# Patient Record
Sex: Female | Born: 1950 | Race: Black or African American | Hispanic: No | Smoking: Former smoker
Health system: Southern US, Community
[De-identification: ages and names within clinical notes are randomized; demographics above are authoritative.]

## PROBLEM LIST (undated history)

## (undated) DIAGNOSIS — M5430 Sciatica, unspecified side: Secondary | ICD-10-CM

## (undated) DIAGNOSIS — I471 Supraventricular tachycardia, unspecified: Secondary | ICD-10-CM

## (undated) DIAGNOSIS — R079 Chest pain, unspecified: Secondary | ICD-10-CM

## (undated) DIAGNOSIS — Z9889 Other specified postprocedural states: Secondary | ICD-10-CM

## (undated) DIAGNOSIS — K589 Irritable bowel syndrome without diarrhea: Secondary | ICD-10-CM

## (undated) DIAGNOSIS — E119 Type 2 diabetes mellitus without complications: Secondary | ICD-10-CM

## (undated) DIAGNOSIS — M329 Systemic lupus erythematosus, unspecified: Secondary | ICD-10-CM

## (undated) DIAGNOSIS — R002 Palpitations: Secondary | ICD-10-CM

## (undated) DIAGNOSIS — E079 Disorder of thyroid, unspecified: Secondary | ICD-10-CM

## (undated) DIAGNOSIS — Z8679 Personal history of other diseases of the circulatory system: Secondary | ICD-10-CM

## (undated) DIAGNOSIS — I10 Essential (primary) hypertension: Secondary | ICD-10-CM

## (undated) DIAGNOSIS — IMO0002 Reserved for concepts with insufficient information to code with codable children: Secondary | ICD-10-CM

## (undated) HISTORY — PX: ABDOMINAL HYSTERECTOMY: SHX81

## (undated) HISTORY — PX: ESOPHAGEAL DILATION: SHX303

## (undated) HISTORY — PX: APPENDECTOMY: SHX54

## (undated) HISTORY — PX: HERNIA REPAIR: SHX51

---

## 2008-08-29 ENCOUNTER — Emergency Department (HOSPITAL_COMMUNITY): Admission: EM | Admit: 2008-08-29 | Discharge: 2008-08-29 | Payer: Self-pay | Admitting: Emergency Medicine

## 2010-05-08 LAB — CBC
HCT: 36.8 % (ref 36.0–46.0)
Hemoglobin: 12.2 g/dL (ref 12.0–15.0)
MCHC: 33.2 g/dL (ref 30.0–36.0)
MCV: 85.3 fL (ref 78.0–100.0)
RBC: 4.32 MIL/uL (ref 3.87–5.11)

## 2010-05-08 LAB — DIFFERENTIAL
Lymphocytes Relative: 28 % (ref 12–46)
Monocytes Absolute: 0.5 10*3/uL (ref 0.1–1.0)
Monocytes Relative: 4 % (ref 3–12)
Neutro Abs: 8.8 10*3/uL — ABNORMAL HIGH (ref 1.7–7.7)

## 2010-05-08 LAB — URINE MICROSCOPIC-ADD ON

## 2010-05-08 LAB — URINALYSIS, ROUTINE W REFLEX MICROSCOPIC
Glucose, UA: NEGATIVE mg/dL
Leukocytes, UA: NEGATIVE
Specific Gravity, Urine: 1.013 (ref 1.005–1.030)
Urobilinogen, UA: 0.2 mg/dL (ref 0.0–1.0)

## 2010-05-08 LAB — BASIC METABOLIC PANEL
CO2: 28 mEq/L (ref 19–32)
Chloride: 100 mEq/L (ref 96–112)
GFR calc Af Amer: >60 mL/min (ref >60)
Potassium: 3 mEq/L — ABNORMAL LOW (ref 3.5–5.1)
Sodium: 138 mEq/L (ref 135–145)

## 2010-05-08 LAB — RAPID URINE DRUG SCREEN, HOSP PERFORMED
Benzodiazepines: NOT DETECTED
Tetrahydrocannabinol: NOT DETECTED

## 2014-04-26 ENCOUNTER — Other Ambulatory Visit (HOSPITAL_COMMUNITY): Payer: Self-pay

## 2014-04-26 ENCOUNTER — Encounter (HOSPITAL_COMMUNITY): Payer: Self-pay | Admitting: *Deleted

## 2014-04-26 ENCOUNTER — Emergency Department (HOSPITAL_COMMUNITY): Payer: Medicare Other

## 2014-04-26 ENCOUNTER — Observation Stay (HOSPITAL_COMMUNITY)
Admission: EM | Admit: 2014-04-26 | Discharge: 2014-04-27 | Disposition: A | Discharge status: Home / Self Care | Payer: Medicare Other | Attending: Internal Medicine | Admitting: Internal Medicine

## 2014-04-26 DIAGNOSIS — R0602 Shortness of breath: Secondary | ICD-10-CM | POA: Insufficient documentation

## 2014-04-26 DIAGNOSIS — E119 Type 2 diabetes mellitus without complications: Secondary | ICD-10-CM

## 2014-04-26 DIAGNOSIS — Z8679 Personal history of other diseases of the circulatory system: Secondary | ICD-10-CM

## 2014-04-26 DIAGNOSIS — Z886 Allergy status to analgesic agent status: Secondary | ICD-10-CM | POA: Diagnosis not present

## 2014-04-26 DIAGNOSIS — R9431 Abnormal electrocardiogram [ECG] [EKG]: Secondary | ICD-10-CM | POA: Diagnosis not present

## 2014-04-26 DIAGNOSIS — Z9889 Other specified postprocedural states: Secondary | ICD-10-CM

## 2014-04-26 DIAGNOSIS — I10 Essential (primary) hypertension: Secondary | ICD-10-CM | POA: Diagnosis not present

## 2014-04-26 DIAGNOSIS — Z9049 Acquired absence of other specified parts of digestive tract: Secondary | ICD-10-CM | POA: Insufficient documentation

## 2014-04-26 DIAGNOSIS — I471 Supraventricular tachycardia, unspecified: Secondary | ICD-10-CM | POA: Diagnosis present

## 2014-04-26 DIAGNOSIS — R002 Palpitations: Secondary | ICD-10-CM | POA: Diagnosis present

## 2014-04-26 DIAGNOSIS — E118 Type 2 diabetes mellitus with unspecified complications: Secondary | ICD-10-CM

## 2014-04-26 DIAGNOSIS — E876 Hypokalemia: Secondary | ICD-10-CM

## 2014-04-26 DIAGNOSIS — Z888 Allergy status to other drugs, medicaments and biological substances status: Secondary | ICD-10-CM | POA: Diagnosis not present

## 2014-04-26 DIAGNOSIS — M329 Systemic lupus erythematosus, unspecified: Secondary | ICD-10-CM | POA: Diagnosis not present

## 2014-04-26 DIAGNOSIS — R531 Weakness: Secondary | ICD-10-CM

## 2014-04-26 DIAGNOSIS — Z87891 Personal history of nicotine dependence: Secondary | ICD-10-CM | POA: Diagnosis not present

## 2014-04-26 DIAGNOSIS — R079 Chest pain, unspecified: Secondary | ICD-10-CM | POA: Diagnosis present

## 2014-04-26 DIAGNOSIS — R0609 Other forms of dyspnea: Secondary | ICD-10-CM

## 2014-04-26 HISTORY — DX: Essential (primary) hypertension: I10

## 2014-04-26 HISTORY — DX: Supraventricular tachycardia: I47.1

## 2014-04-26 HISTORY — DX: Supraventricular tachycardia, unspecified: I47.10

## 2014-04-26 HISTORY — DX: Other specified postprocedural states: Z98.890

## 2014-04-26 HISTORY — DX: Type 2 diabetes mellitus without complications: E11.9

## 2014-04-26 HISTORY — DX: Personal history of other diseases of the circulatory system: Z86.79

## 2014-04-26 HISTORY — DX: Systemic lupus erythematosus, unspecified: M32.9

## 2014-04-26 LAB — CBC WITH DIFFERENTIAL/PLATELET
Basophils Absolute: 0 10*3/uL (ref 0.0–0.1)
Basophils Relative: 0 % (ref 0–1)
EOS ABS: 0.1 10*3/uL (ref 0.0–0.7)
EOS PCT: 1 % (ref 0–5)
HEMATOCRIT: 38.2 % (ref 36.0–46.0)
Hemoglobin: 12.4 g/dL (ref 12.0–15.0)
LYMPHS PCT: 29 % (ref 12–46)
Lymphs Abs: 3 10*3/uL (ref 0.7–4.0)
MCH: 26.6 pg (ref 26.0–34.0)
MCHC: 32.5 g/dL (ref 30.0–36.0)
MCV: 81.8 fL (ref 78.0–100.0)
MONOS PCT: 6 % (ref 3–12)
Monocytes Absolute: 0.7 10*3/uL (ref 0.1–1.0)
NEUTROS PCT: 64 % (ref 43–77)
Neutro Abs: 6.7 10*3/uL (ref 1.7–7.7)
Platelets: 228 10*3/uL (ref 150–400)
RBC: 4.67 MIL/uL (ref 3.87–5.11)
RDW: 17.5 % — AB (ref 11.5–15.5)
WBC: 10.5 10*3/uL (ref 4.0–10.5)

## 2014-04-26 LAB — BASIC METABOLIC PANEL
ANION GAP: 11 (ref 5–15)
BUN: 18 mg/dL (ref 6–23)
CALCIUM: 9.6 mg/dL (ref 8.4–10.5)
CHLORIDE: 101 mmol/L (ref 96–112)
CO2: 25 mmol/L (ref 19–32)
Creatinine, Ser: 0.98 mg/dL (ref 0.50–1.10)
GFR, EST AFRICAN AMERICAN: 68 mL/min — AB (ref >90)
GFR, EST NON AFRICAN AMERICAN: 59 mL/min — AB (ref >90)
Glucose, Bld: 139 mg/dL — ABNORMAL HIGH (ref 70–99)
POTASSIUM: 3.3 mmol/L — AB (ref 3.5–5.1)
SODIUM: 137 mmol/L (ref 135–145)

## 2014-04-26 LAB — I-STAT TROPONIN, ED: Troponin i, poc: 0 ng/mL (ref 0.00–0.08)

## 2014-04-26 LAB — BRAIN NATRIURETIC PEPTIDE: B NATRIURETIC PEPTIDE 5: 51.7 pg/mL (ref 0.0–100.0)

## 2014-04-26 LAB — D-DIMER, QUANTITATIVE: D-Dimer, Quant: 0.47 ug/mL-FEU (ref 0.00–0.48)

## 2014-04-26 MED ORDER — FELODIPINE ER 10 MG PO TB24
10.0000 mg | ORAL_TABLET | Freq: Every day | ORAL | Status: DC
  Administered 2014-04-27: 10 mg via ORAL
  Filled 2014-04-26: qty 1

## 2014-04-26 MED ORDER — INSULIN ASPART 100 UNIT/ML ~~LOC~~ SOLN: 0.0000 [IU] | Freq: Every day | SUBCUTANEOUS | Status: DC

## 2014-04-26 MED ORDER — INSULIN ASPART 100 UNIT/ML ~~LOC~~ SOLN: 0.0000 [IU] | Freq: Three times a day (TID) | SUBCUTANEOUS | Status: DC

## 2014-04-26 MED ORDER — NITROGLYCERIN 0.4 MG SL SUBL: 0.4000 mg | SUBLINGUAL_TABLET | SUBLINGUAL | Status: DC | PRN

## 2014-04-26 MED ORDER — MORPHINE SULFATE 2 MG/ML IJ SOLN
2.0000 mg | INTRAMUSCULAR | Status: DC | PRN
  Administered 2014-04-27: 2 mg via INTRAVENOUS
  Filled 2014-04-26: qty 1

## 2014-04-26 MED ORDER — ONDANSETRON HCL 4 MG/2ML IJ SOLN: 4.0000 mg | Freq: Four times a day (QID) | INTRAMUSCULAR | Status: DC | PRN

## 2014-04-26 MED ORDER — PREDNISOLONE 5 MG PO TABS
5.0000 mg | ORAL_TABLET | Freq: Every day | ORAL | Status: DC
  Administered 2014-04-27: 5 mg via ORAL
  Filled 2014-04-26: qty 1

## 2014-04-26 MED ORDER — PANTOPRAZOLE SODIUM 40 MG PO TBEC
40.0000 mg | DELAYED_RELEASE_TABLET | Freq: Every day | ORAL | Status: DC
  Administered 2014-04-27: 40 mg via ORAL
  Filled 2014-04-26: qty 1

## 2014-04-26 MED ORDER — ASPIRIN EC 81 MG PO TBEC
81.0000 mg | DELAYED_RELEASE_TABLET | Freq: Every day | ORAL | Status: DC
  Administered 2014-04-27: 81 mg via ORAL
  Filled 2014-04-26: qty 1

## 2014-04-26 MED ORDER — ENOXAPARIN SODIUM 40 MG/0.4ML ~~LOC~~ SOLN
40.0000 mg | SUBCUTANEOUS | Status: DC
  Administered 2014-04-27: 40 mg via SUBCUTANEOUS
  Filled 2014-04-26 (×2): qty 0.4

## 2014-04-26 MED ORDER — POTASSIUM CHLORIDE CRYS ER 20 MEQ PO TBCR
40.0000 meq | EXTENDED_RELEASE_TABLET | Freq: Once | ORAL | Status: AC
  Administered 2014-04-27: 40 meq via ORAL
  Filled 2014-04-26: qty 2

## 2014-04-26 MED ORDER — METOPROLOL TARTRATE 25 MG PO TABS
100.0000 mg | ORAL_TABLET | Freq: Two times a day (BID) | ORAL | Status: DC
  Administered 2014-04-27 (×2): 100 mg via ORAL
  Filled 2014-04-26 (×3): qty 4

## 2014-04-26 MED ORDER — ACETAMINOPHEN 325 MG PO TABS
650.0000 mg | ORAL_TABLET | ORAL | Status: DC | PRN
  Administered 2014-04-26: 650 mg via ORAL
  Filled 2014-04-26: qty 2

## 2014-04-26 MED ORDER — ATORVASTATIN CALCIUM 40 MG PO TABS
40.0000 mg | ORAL_TABLET | Freq: Every day | ORAL | Status: DC
  Filled 2014-04-26: qty 1

## 2014-04-26 NOTE — ED Notes (Signed)
MD at bedside. 

## 2014-04-26 NOTE — ED Provider Notes (Signed)
CSN: [id number]     Arrival date & time 04/26/14  1905 History   First MD Initiated Contact with Patient 04/26/14 1923     Chief Complaint  Patient presents with  . Shortness of Breath     (Consider location/radiation/quality/duration/timing/severity/associated sxs/prior Treatment) HPI Comments: PT comes in with cc of shortness of breath, weakness, and palpitations. Pt has hx of PSVT. She denies any CAD hx. Reports having palpitations earlier today, with shortness of breath and dizziness. She has loop recorder in place - interrogation reveals frequent episodes of tachycardia earlier today. Pt arrives to the ER in sinus rhythm, still feeling a little dyspneic, but with no chest pain. She has had stress test in the past 1 year, negative yield.  Patient is a 64 y.o. female presenting with shortness of breath. The history is provided by the patient.  Shortness of Breath Associated symptoms: no abdominal pain, no chest pain, no headaches and no vomiting     Past Medical History  Diagnosis Date  . SVT (supraventricular tachycardia)    Past Surgical History  Procedure Laterality Date  . Appendectomy    . Abdominal hysterectomy    . Hernia repair    . Esophageal dilation     No family history on file. History  Substance Use Topics  . Smoking status: Former Smoker    Types: Cigarettes  . Smokeless tobacco: Not on file  . Alcohol Use: No   OB History    No data available     Review of Systems  Constitutional: Positive for activity change.  Respiratory: Positive for shortness of breath.   Cardiovascular: Positive for palpitations. Negative for chest pain.  Gastrointestinal: Negative for nausea, vomiting and abdominal pain.  Genitourinary: Negative for dysuria.  Neurological: Positive for weakness. Negative for headaches.  All other systems reviewed and are negative.     Allergies  Nexium  Home Medications   Prior to Admission medications   Not on File   BP 145/72  mmHg  Pulse 82  Temp(Src) 98 F (36.7 C) (Oral)  Resp 15  Ht 5\' 4"  (1.626 m)  Wt 217 lb (98.431 kg)  BMI 37.23 kg/m2  SpO2 98% Physical Exam  Constitutional: She is oriented to person, place, and time. She appears well-developed and well-nourished.  HENT:  Head: Normocephalic and atraumatic.  Eyes: EOM are normal. Pupils are equal, round, and reactive to light.  Neck: Neck supple. No JVD present.  Cardiovascular: Normal rate, regular rhythm and normal heart sounds.   Pulmonary/Chest: Effort normal. No respiratory distress.  Abdominal: Soft. She exhibits no distension. There is no tenderness. There is no rebound and no guarding.  Neurological: She is alert and oriented to person, place, and time.  Skin: Skin is warm and dry.  Nursing note and vitals reviewed.   ED Course  Procedures (including critical care time) Labs Review Labs Reviewed  CBC WITH DIFFERENTIAL/PLATELET - Abnormal; Notable for the following:    RDW 17.5 (*)    All other components within normal limits  BASIC METABOLIC PANEL - Abnormal; Notable for the following:    Potassium 3.3 (*)    Glucose, Bld 139 (*)    GFR calc non Af Amer 59 (*)    GFR calc Af Amer 68 (*)    All other components within normal limits  BRAIN NATRIURETIC PEPTIDE  HEPATIC FUNCTION PANEL  LIPASE, BLOOD  MAGNESIUM  I-STAT TROPOININ, ED    Imaging Review Dg Chest Portable 1 View  04/26/2014  CLINICAL DATA:  Shortness of Breath  EXAM: PORTABLE CHEST - 1 VIEW  COMPARISON:  08/29/2008  FINDINGS: Cardiac shadow is stable. The lungs are clear bilaterally. A monitoring device is noted over the anterior midline. The bony structures are within normal limits.  IMPRESSION: No acute abnormality noted.   Electronically Signed   By: Alcide Clever M.D.   On: 04/26/2014 20:06     EKG Interpretation None      MDM   Final diagnoses:  Dyspnea on exertion  Weakness    Pt comes in with cc of DIB, weakness. She has hx of PSVT - and her initial  shortness of breath was associated with the palpitations - which resolved spontaneously. She is still feeling a little short of breath. There is no chest pain at any point.  EKG has avR ST elevation, with inferior and lateral depressions. Repeat EKG has no dynamic changes, no contiguous leads showing the ST elevation. Ran it by Cards, and Dr. [place] agrees, that likely some ischemic changes, but no STEMI critieria med.  Given all the information, we think it is better to admit patient to the hospital for a rule out.  Derwood Kaplan, MD 04/26/14 2235

## 2014-04-26 NOTE — ED Notes (Signed)
Two attempts at starting an IV in the right hand and AC. A successful IV was achieved both times and after the needle was removed the patient jumped and complained of pain. Positive blood draw back and no resistance during flush but due to patients complaints of pain the IV's were removed. Awaiting to see if an order for fluids or IV medications are ordered before another attempt at an IV is attempted.

## 2014-04-26 NOTE — ED Notes (Signed)
To ED via [facility] for eval of sob and weakness. Per EMS, pt had a palpable pulse at a rate of approx 180 on first contact. Once monitor placed pt's rate was down to 100. Hx of SVT, last being in Feb. Pt has a LOOP monitor. States she is SOB but not as much as she was earlier. C/o right side pain. No nausea. Skin w/d.

## 2014-04-26 NOTE — H&P (Signed)
[facility] History and Physical  Patient: Anna Lester  MRN: [id number]  DOB: January 14, 1951  DOS: the patient was seen and examined on 04/26/2014 PCP: Rocky Morel, MD  Chief Complaint: palpitation  HPI: Anna Lester is a 64 y.o. female with Past medical history of SVT, SLE, hypertension, diabetes mellitus, failed attempt for radiofrequency ablation for SVT, loop recorder. The patient is presenting with complaints of palpitation. She mentions that she knows when she has an episode of SVT and today after working toward a day later on in the afternoon she started having complaints of palpitation. She also felt dizzy associated with shortness of breath. She had to lie down to feel better. The episode resolved on its own within a few minutes but then she started having similar symptoms again. This time she had severe pain on the right side as well and the pain was not the similar to her prior pain. The pain feels like a dull ache. Pain does not get worse with pressure does not get worse with movement. Patient mentions she is compliant with all medications. Patient needs to get her prior medical care at [facility] and has moved here since February beginning.  The patient is coming from home. And at her baseline independent for most of her ADL.  Review of Systems: as mentioned in the history of present illness.  A Comprehensive review of the other systems is negative.  Past Medical History  Diagnosis Date  . SVT (supraventricular tachycardia)   . SLE (systemic lupus erythematosus)   . Essential hypertension   . Diabetes mellitus, type 2   . S/P radiofrequency ablation operation for arrhythmia    Past Surgical History  Procedure Laterality Date  . Appendectomy    . Abdominal hysterectomy    . Hernia repair    . Esophageal dilation     Social History:  reports that she has quit smoking. Her smoking use included Cigarettes. She does not have any smokeless tobacco  history on file. She reports that she does not drink alcohol. Her drug history is not on file.  Allergies  Allergen Reactions  . Ace Inhibitors Other (See Comments)    cough  . Amlodipine Swelling  . Codeine Nausea Only  . Isosorbide Other (See Comments)    dizziness  . Metformin And Related Diarrhea  . Tramadol Nausea Only  . Nexium [Esomeprazole] Rash    Family History  Problem Relation Age of Onset  . Colon cancer Mother   . Leukemia Sister     Prior to Admission medications   Medication Sig Start Date End Date Taking? Authorizing Provider  acetaminophen (TYLENOL) 500 MG tablet Take by mouth. 02/13/13   Historical Provider, MD  ammonium lactate (LAC-HYDRIN) 12 % lotion Apply to both feet BID  Indications: Abnormal Dryness of Skin, Eyes or Mucous Membranes 03/04/13   Historical Provider, MD  aspirin 81 MG EC tablet Take by mouth. 11/20/12   Historical Provider, MD  atorvastatin (LIPITOR) 40 MG tablet Take by mouth. 02/13/13   Historical Provider, MD  felodipine (PLENDIL) 10 MG 24 hr tablet Take by mouth. 04/04/13   Historical Provider, MD  glimepiride (AMARYL) 4 MG tablet Take by mouth. 05/05/13   Historical Provider, MD  losartan-hydrochlorothiazide Mauri Reading) 100-25 MG per tablet Take by mouth. 05/21/13   Historical Provider, MD  metoprolol (LOPRESSOR) 100 MG tablet Take by mouth. 01/21/13   Historical Provider, MD  omeprazole (PRILOSEC) 40 MG capsule Take by mouth. 03/28/13   Historical Provider, MD  polyethylene glycol (MIRALAX / GLYCOLAX) packet Take by mouth. 04/30/13   Historical Provider, MD  prednisoLONE 5 MG TABS tablet Take by mouth.    Historical Provider, MD  pregabalin (LYRICA) 75 MG capsule Take by mouth. 04/23/13   Historical Provider, MD    Physical Exam: Filed Vitals:   04/26/14 2130 04/26/14 2145 04/26/14 2200 04/26/14 2215  BP: 156/74 155/87 132/68 145/72  Pulse: 81 78 84 82  Temp:      TempSrc:      Resp: 8 21 21 15   Height:      Weight:      SpO2: 100% 99% 96%  98%    General: Alert, Awake and Oriented to Time, Place and Person. Appear in mild distress Eyes: PERRL ENT: Oral Mucosa clear moist. Neck: no JVD Cardiovascular: S1 and S2 Present, no Murmur, Peripheral Pulses Present Respiratory: Bilateral Air entry equal and Decreased, Clear to Auscultation, noCrackles, no wheezes Abdomen: Bowel Sound presentn, Soft and nopn tender Skin: no Rash Extremities: Bilateral Pedal edema, non calf tenderness Neurologic: Grossly no focal neuro deficit.  Labs on Admission:  CBC:  Recent Labs Lab 04/26/14 1946  WBC 10.5  NEUTROABS 6.7  HGB 12.4  HCT 38.2  MCV 81.8  PLT 228    CMP     Component Value Date/Time   NA 137 04/26/2014 1946   K 3.3* 04/26/2014 1946   CL 101 04/26/2014 1946   CO2 25 04/26/2014 1946   GLUCOSE 139* 04/26/2014 1946   BUN 18 04/26/2014 1946   CREATININE 0.98 04/26/2014 1946   CALCIUM 9.6 04/26/2014 1946   GFRNONAA 59* 04/26/2014 1946   GFRAA 68* 04/26/2014 1946    No results for input(s): LIPASE, AMYLASE in the last 168 hours.  No results for input(s): CKTOTAL, CKMB, CKMBINDEX, TROPONINI in the last 168 hours. BNP (last 3 results)  Recent Labs  04/26/14 1946  BNP 51.7    ProBNP (last 3 results) No results for input(s): PROBNP in the last 8760 hours.   Radiological Exams on Admission: Dg Chest Portable 1 View  04/26/2014   CLINICAL DATA:  Shortness of Breath  EXAM: PORTABLE CHEST - 1 VIEW  COMPARISON:  08/29/2008  FINDINGS: Cardiac shadow is stable. The lungs are clear bilaterally. A monitoring device is noted over the anterior midline. The bony structures are within normal limits.  IMPRESSION: No acute abnormality noted.   Electronically Signed   By: Alcide CleverMark  Lukens M.D.   On: 04/26/2014 20:06   EKG: Independently reviewed. normal sinus rhythm, nonspecific ST and T waves changes.  Assessment/Plan Principal Problem:   SVT (supraventricular tachycardia) Active Problems:   Abnormal EKG   Chest pain   SLE  (systemic lupus erythematosus)   Essential hypertension   Diabetes mellitus, type 2   S/P radiofrequency ablation operation for arrhythmia   1. SVT (supraventricular tachycardia) The patient is presenting with complaints of palpitation and shortness of breath with dizziness. EKG does not show any evidence of arrhythmia. He does show evidence of ST-T wave changes which was discussed with cardiology by ER physician. Currently recommended to rule out for ACS. Therefore the patient will be admitted in the hospital for observation. Monitor serial troponin, we will get echocardiogram In the morning. Also medical and cardiology consultation for further workup related to SVT as well as for evaluation of chest pain.  Also replace potassium 4 hypokalemia. Check serum magnesium.  2. SLE. Continue prednisone.  3. History of diabetes mellitus. The patient has not taking her  oral hypoglycemic agent. Continue sliding scale check hemoglobin A1c.  Advance goals of care discussion: Full code   Consults: Cardiology  DVT Prophylaxis:  chronic therapeutic anticoagulation. Nutrition: Nothing by mouth except medications  Disposition: Admitted to observation in telemetry unit.  Author: Lynden Oxford, MD [facility] Pager: [phone] 04/26/2014, 10:58 PM    If 7PM-7AM, please contact night-coverage [email or web address] Password [email or web address]

## 2014-04-27 ENCOUNTER — Encounter (HOSPITAL_COMMUNITY): Payer: Self-pay | Admitting: *Deleted

## 2014-04-27 DIAGNOSIS — Z9889 Other specified postprocedural states: Secondary | ICD-10-CM

## 2014-04-27 DIAGNOSIS — R0789 Other chest pain: Secondary | ICD-10-CM | POA: Diagnosis not present

## 2014-04-27 DIAGNOSIS — I471 Supraventricular tachycardia: Secondary | ICD-10-CM | POA: Diagnosis not present

## 2014-04-27 DIAGNOSIS — E119 Type 2 diabetes mellitus without complications: Secondary | ICD-10-CM

## 2014-04-27 DIAGNOSIS — I10 Essential (primary) hypertension: Secondary | ICD-10-CM | POA: Diagnosis not present

## 2014-04-27 DIAGNOSIS — R9431 Abnormal electrocardiogram [ECG] [EKG]: Secondary | ICD-10-CM | POA: Diagnosis not present

## 2014-04-27 LAB — COMPREHENSIVE METABOLIC PANEL
ALT: 19 U/L (ref 0–35)
AST: 23 U/L (ref 0–37)
Albumin: 3.4 g/dL — ABNORMAL LOW (ref 3.5–5.2)
Alkaline Phosphatase: 107 U/L (ref 39–117)
Anion gap: 10 (ref 5–15)
BUN: 18 mg/dL (ref 6–23)
CALCIUM: 9.3 mg/dL (ref 8.4–10.5)
CO2: 25 mmol/L (ref 19–32)
Chloride: 102 mmol/L (ref 96–112)
Creatinine, Ser: 0.89 mg/dL (ref 0.50–1.10)
GFR calc Af Amer: 77 mL/min — ABNORMAL LOW (ref >90)
GFR calc non Af Amer: 66 mL/min — ABNORMAL LOW (ref >90)
Glucose, Bld: 101 mg/dL — ABNORMAL HIGH (ref 70–99)
Potassium: 3.8 mmol/L (ref 3.5–5.1)
SODIUM: 137 mmol/L (ref 135–145)
TOTAL PROTEIN: 6.4 g/dL (ref 6.0–8.3)
Total Bilirubin: 0.5 mg/dL (ref 0.3–1.2)

## 2014-04-27 LAB — HEPATIC FUNCTION PANEL
ALBUMIN: 4 g/dL (ref 3.5–5.2)
ALK PHOS: 132 U/L — AB (ref 39–117)
ALT: 22 U/L (ref 0–35)
AST: 28 U/L (ref 0–37)
Bilirubin, Direct: <0.1 mg/dL (ref 0.0–0.5)
Total Bilirubin: 0.2 mg/dL — ABNORMAL LOW (ref 0.3–1.2)
Total Protein: 7.8 g/dL (ref 6.0–8.3)

## 2014-04-27 LAB — GLUCOSE, CAPILLARY
GLUCOSE-CAPILLARY: 109 mg/dL — AB (ref 70–99)
GLUCOSE-CAPILLARY: 119 mg/dL — AB (ref 70–99)
Glucose-Capillary: 101 mg/dL — ABNORMAL HIGH (ref 70–99)

## 2014-04-27 LAB — TROPONIN I
TROPONIN I: 0.03 ng/mL (ref <0.031)
Troponin I: <0.03 ng/mL (ref <0.031)

## 2014-04-27 LAB — CBC WITH DIFFERENTIAL/PLATELET
Basophils Absolute: 0 10*3/uL (ref 0.0–0.1)
Basophils Relative: 0 % (ref 0–1)
Eosinophils Absolute: 0.1 10*3/uL (ref 0.0–0.7)
Eosinophils Relative: 1 % (ref 0–5)
HCT: 37 % (ref 36.0–46.0)
HEMOGLOBIN: 11.9 g/dL — AB (ref 12.0–15.0)
LYMPHS PCT: 38 % (ref 12–46)
Lymphs Abs: 3.7 10*3/uL (ref 0.7–4.0)
MCH: 26.2 pg (ref 26.0–34.0)
MCHC: 32.2 g/dL (ref 30.0–36.0)
MCV: 81.3 fL (ref 78.0–100.0)
Monocytes Absolute: 0.5 10*3/uL (ref 0.1–1.0)
Monocytes Relative: 5 % (ref 3–12)
NEUTROS ABS: 5.5 10*3/uL (ref 1.7–7.7)
NEUTROS PCT: 56 % (ref 43–77)
Platelets: 203 10*3/uL (ref 150–400)
RBC: 4.55 MIL/uL (ref 3.87–5.11)
RDW: 17.4 % — ABNORMAL HIGH (ref 11.5–15.5)
WBC: 9.8 10*3/uL (ref 4.0–10.5)

## 2014-04-27 LAB — TSH: TSH: 1.437 u[IU]/mL (ref 0.350–4.500)

## 2014-04-27 LAB — T4, FREE: FREE T4: 1.09 ng/dL (ref 0.80–1.80)

## 2014-04-27 LAB — MAGNESIUM: Magnesium: 2.1 mg/dL (ref 1.5–2.5)

## 2014-04-27 LAB — LIPASE, BLOOD: Lipase: 33 U/L (ref 11–59)

## 2014-04-27 MED ORDER — METOPROLOL TARTRATE 100 MG PO TABS: 100.0000 mg | ORAL_TABLET | Freq: Two times a day (BID) | ORAL | Status: AC

## 2014-04-27 NOTE — Consult Note (Signed)
Admit date: 04/26/2014 Referring Physician  Dr. Jomarie Longs Primary Physician Rocky Morel, MD Primary Cardiologist  new Reason for Consultation  PSVT  HPI: 64 year old with PSVT on chronic metoprolol with prior ablation in [place] (EP in [place] states that she might need to go back in but described risk of pacer) who came in with palpitations characteristic of her prior episodes of SVT. Lie down, improved, dizzy SOB. Resolved on own and then returned. Right sided pain during episode. Dull. Fleeting.   She states that she is having these SVT episodes about twice a week and they are usually brought on by exertion and short lived.   Loop recorder in place - shows frequent episode of tachycardia (per ER interrogation). Per EMS had pulse of 180 on contact with patient (palpable pulse). HR down to 100 once monitor placed.   NUC stress less than one year ago was normal per patient.   Troponin is normal.   Now feels great. No CP, no SOB. Getting back to normal she states.   TELE with no further SVT.    PMH:   Past Medical History  Diagnosis Date  . SVT (supraventricular tachycardia)   . SLE (systemic lupus erythematosus)   . Essential hypertension   . Diabetes mellitus, type 2   . S/P radiofrequency ablation operation for arrhythmia     PSH:   Past Surgical History  Procedure Laterality Date  . Appendectomy    . Abdominal hysterectomy    . Hernia repair    . Esophageal dilation     Allergies:  Ace inhibitors; Amlodipine; Codeine; Isosorbide; Metformin and related; Tramadol; and Nexium Prior to Admit Meds:   Prior to Admission medications   Medication Sig Start Date End Date Taking? Authorizing Provider  acetaminophen (TYLENOL) 500 MG tablet Take by mouth. 02/13/13   Historical Provider, MD  ammonium lactate (LAC-HYDRIN) 12 % lotion Apply to both feet BID  Indications: Abnormal Dryness of Skin, Eyes or Mucous Membranes 03/04/13   Historical Provider, MD  aspirin 81 MG EC tablet Take  by mouth. 11/20/12   Historical Provider, MD  atorvastatin (LIPITOR) 40 MG tablet Take by mouth. 02/13/13   Historical Provider, MD  felodipine (PLENDIL) 10 MG 24 hr tablet Take by mouth. 04/04/13   Historical Provider, MD  glimepiride (AMARYL) 4 MG tablet Take by mouth. 05/05/13   Historical Provider, MD  losartan-hydrochlorothiazide (HYZAAR) 100-25 MG per tablet Take by mouth. 05/21/13   Historical Provider, MD  metoprolol (LOPRESSOR) 100 MG tablet Take by mouth. 01/21/13   Historical Provider, MD  omeprazole (PRILOSEC) 40 MG capsule Take by mouth. 03/28/13   Historical Provider, MD  polyethylene glycol (MIRALAX / GLYCOLAX) packet Take by mouth. 04/30/13   Historical Provider, MD  prednisoLONE 5 MG TABS tablet Take by mouth.    Historical Provider, MD  pregabalin (LYRICA) 75 MG capsule Take by mouth. 04/23/13   Historical Provider, MD   Fam HX:    Family History  Problem Relation Age of Onset  . Colon cancer Mother   . Leukemia Sister    Social HX:    History   Social History  . Marital Status: Widowed    Spouse Name: N/A  . Number of Children: N/A  . Years of Education: N/A   Occupational History  . Not on file.   Social History Main Topics  . Smoking status: Former Smoker    Types: Cigarettes  . Smokeless tobacco: Not on file  . Alcohol  Use: No  . Drug Use: Not on file  . Sexual Activity: Not on file   Other Topics Concern  . Not on file   Social History Narrative     ROS:  All 11 ROS were addressed and are negative except what is stated in the HPI. Occasional abdominal pain. Obese. No bleeding, no syncope.    Physical Exam: Blood pressure 124/63, pulse 75, temperature 98.1 F (36.7 C), temperature source Oral, resp. rate 16, height  (1.626 m), weight 216 lb 0.8 oz (98 kg), SpO2 95 %.   General: Well developed, well nourished, in no acute distress Head: Eyes PERRLA, No xanthomas.   Normal cephalic and atramatic  Lungs:   Clear bilaterally to auscultation and  percussion. Normal respiratory effort. No wheezes, no rales. Heart:   HRRR S1 S2 Pulses are 2+ & equal. No murmur, rubs, gallops.  No carotid bruit. No JVD.  No abdominal bruits.  Abdomen: Bowel sounds are positive, abdomen soft and non-tender without masses. No hepatosplenomegaly. Overweight Msk:  Back normal. Normal strength and tone for age. Extremities:  No clubbing, cyanosis or edema.  DP +1 Neuro: Alert and oriented X 3, non-focal, MAE x 4 GU: Deferred Rectal: Deferred Psych:  Good affect, responds appropriately      Labs: Lab Results  Component Value Date   WBC 9.8 04/27/2014   HGB 11.9* 04/27/2014   HCT 37.0 04/27/2014   MCV 81.3 04/27/2014   PLT 203 04/27/2014     Recent Labs Lab 04/27/14 0440  NA 137  K 3.8  CL 102  CO2 25  BUN 18  CREATININE 0.89  CALCIUM 9.3  PROT 6.4  BILITOT 0.5  ALKPHOS 107  ALT 19  AST 23  GLUCOSE 101*   BNP-51 - normal  Recent Labs  04/26/14 2322 04/27/14 0440  TROPONINI 0.03 <0.03   No results found for: CHOL, HDL, LDLCALC, TRIG Lab Results  Component Value Date   DDIMER 0.47 04/26/2014     Radiology:  Dg Chest Portable 1 View  04/26/2014   CLINICAL DATA:  Shortness of Breath  EXAM: PORTABLE CHEST - 1 VIEW  COMPARISON:  08/29/2008  FINDINGS: Cardiac shadow is stable. The lungs are clear bilaterally. A monitoring device is noted over the anterior midline. The bony structures are within normal limits.  IMPRESSION: No acute abnormality noted.   Electronically Signed   By: Alcide Clever M.D.   On: 04/26/2014 20:06   Personally viewed.  EKG:  04/26/14 at 1919 - SR with short PR and diffuse ST scooping non-specific.  Personally viewed.    Scheduled Meds: . aspirin EC  81 mg Oral Daily  . atorvastatin  40 mg Oral q1800  . enoxaparin (LOVENOX) injection  40 mg Subcutaneous Q24H  . felodipine  10 mg Oral Daily  . insulin aspart  0-15 Units Subcutaneous TID WC  . insulin aspart  0-5 Units Subcutaneous QHS  . metoprolol  100 mg  Oral BID  . pantoprazole  40 mg Oral Daily  . prednisoLONE  5 mg Oral Daily   Continuous Infusions:  PRN Meds:.acetaminophen, morphine injection, nitroGLYCERIN, ondansetron (ZOFRAN) IV  ASSESSMENT/PLAN:    64 year old with PSVT, HR 180's per EMS on palpation in ER 100, now NSR with prior ablation in 2010 in [place], loop recorder in place now feeling well.   1) PSVT - doing well currently. Metoprolol. Received prior Care in [place], had ablation in 2010 or 2011, Stress test last year reportedly  normal. Loop in place.   - Will have EP evaluate for possible repeat EPS vs. Change in medical therapy. For now, continue with metoprolol 100 BID. Discussed with her valsalva maneuvers. EP evaluation can be done as outpatient given her current overall stability and chronicity of symptoms. She is comfortable with this plan. Agree with checking TSH and Free T4. If ECHO not done here, can be performed as outpatient.   - Understands risk of pacemaker with further EPS/ablation. Had this discussion with prior EP [place] in [place].   - Spoke to Trish who will have EP office give her a call to establish timely consult as outpatient.   2) Lupus - per primary team, Prednisone  3) HTN essential - stable. Meds reviewed.   4) DM - per primary team.    Donato SchultzSKAINS, MARK, MD  04/27/2014  9:13 AM

## 2014-04-27 NOTE — Progress Notes (Signed)
UR completed 

## 2014-04-27 NOTE — Progress Notes (Signed)
Pt discharge home. Discharge instruction reviewed. Pt VU.

## 2014-04-27 NOTE — Progress Notes (Signed)
[facility] PROGRESS NOTE  Anna Lester ZOX:[id number] DOB: 01/14/1951 DOA: 04/26/2014 PCP: Rocky Morel, MD  Assessment/Plan: 1. SVT -HR in 180s per EMS initially, now stable,  -continue metoprolol -Cards consult pending -received prior Care in [facility], had ablation in 2010 or 2011, Stress test last year reportedly normal -check TSH/free T4, cycle cardiac enzymes, ECHO pending -was made NPO overnight  2. H/o SLE -on low dose prednisone  3. DM -not on meds, SSI, Fu hbaic  4. HTN -stable, hyzaar on hold  DVt proph: lovenox  Code Status: Full Code Family Communication: none at bedside (indicate person spoken with, relationship, and if by phone, the number) Disposition Plan: Home pending Cards eval   Consultants:  Cards pending  HPI/Subjective: Feels better, no further episodes overnight  Objective: Filed Vitals:   04/27/14 0508  BP: 124/63  Pulse: 75  Temp: 98.1 F (36.7 C)  Resp: 16   No intake or output data in the 24 hours ending 04/27/14 0902 Filed Weights   04/26/14 1908 04/27/14 0112  Weight: 98.431 kg (217 lb) 98 kg (216 lb 0.8 oz)    Exam:   General:  AAOx3  Cardiovascular: S1S2/RRR  Respiratory: CTAB  Abdomen: soft, obese, NT, bS present  Musculoskeletal: no edema c/c   Data Reviewed: Basic Metabolic Panel:  Recent Labs Lab 04/26/14 1946 04/26/14 2322 04/27/14 0440  NA 137  --  137  K 3.3*  --  3.8  CL 101  --  102  CO2 25  --  25  GLUCOSE 139*  --  101*  BUN 18  --  18  CREATININE 0.98  --  0.89  CALCIUM 9.6  --  9.3  MG  --  2.1  --    Liver Function Tests:  Recent Labs Lab 04/26/14 2322 04/27/14 0440  AST 28 23  ALT 22 19  ALKPHOS 132* 107  BILITOT 0.2* 0.5  PROT 7.8 6.4  ALBUMIN 4.0 3.4*    Recent Labs Lab 04/26/14 2322  LIPASE 33   No results for input(s): AMMONIA in the last 168 hours. CBC:  Recent Labs Lab 04/26/14 1946 04/27/14 0440  WBC 10.5 9.8  NEUTROABS 6.7 5.5  HGB 12.4  11.9*  HCT 38.2 37.0  MCV 81.8 81.3  PLT 228 203   Cardiac Enzymes:  Recent Labs Lab 04/26/14 2322 04/27/14 0440  TROPONINI 0.03 <0.03   BNP (last 3 results)  Recent Labs  04/26/14 1946  BNP 51.7    ProBNP (last 3 results) No results for input(s): PROBNP in the last 8760 hours.  CBG:  Recent Labs Lab 04/27/14 0028 04/27/14 0609  GLUCAP 109* 101*    No results found for this or any previous visit (from the past 240 hour(s)).   Studies: Dg Chest Portable 1 View  04/26/2014   CLINICAL DATA:  Shortness of Breath  EXAM: PORTABLE CHEST - 1 VIEW  COMPARISON:  08/29/2008  FINDINGS: Cardiac shadow is stable. The lungs are clear bilaterally. A monitoring device is noted over the anterior midline. The bony structures are within normal limits.  IMPRESSION: No acute abnormality noted.   Electronically Signed   By: Alcide Clever M.D.   On: 04/26/2014 20:06    Scheduled Meds: . aspirin EC  81 mg Oral Daily  . atorvastatin  40 mg Oral q1800  . enoxaparin (LOVENOX) injection  40 mg Subcutaneous Q24H  . felodipine  10 mg Oral Daily  . insulin aspart  0-15 Units Subcutaneous TID WC  .  insulin aspart  0-5 Units Subcutaneous QHS  . metoprolol  100 mg Oral BID  . pantoprazole  40 mg Oral Daily  . prednisoLONE  5 mg Oral Daily   Continuous Infusions:  Antibiotics Given (last 72 hours)    None      Principal Problem:   SVT (supraventricular tachycardia) Active Problems:   Abnormal EKG   Chest pain   SLE (systemic lupus erythematosus)   Essential hypertension   Diabetes mellitus, type 2   S/P radiofrequency ablation operation for arrhythmia    Time spent: 25min    Seaside Behavioral CenterJOSEPH,Johnathon Mittal  [facility] Pager [phone]. If 7PM-7AM, please contact night-coverage at [email or web address], password [email or web address] 04/27/2014, 9:02 AM

## 2014-04-27 NOTE — Progress Notes (Signed)
Pt admitted from ED with c/o of SOB and palpitation, pt settled in bed with family at bedside, SR on the monitor,pt reassured, will however continue to monitor, call light within pt's reach. Obasogie-Asidi, Aditi Rovira Efe

## 2014-04-28 LAB — HEMOGLOBIN A1C
Hgb A1c MFr Bld: 7.2 % — ABNORMAL HIGH (ref 4.8–5.6)
Mean Plasma Glucose: 160 mg/dL

## 2014-04-30 ENCOUNTER — Emergency Department (HOSPITAL_COMMUNITY)
Admission: EM | Admit: 2014-04-30 | Discharge: 2014-04-30 | Disposition: A | Discharge status: Home / Self Care | Payer: Medicare Other | Attending: Emergency Medicine | Admitting: Emergency Medicine

## 2014-04-30 ENCOUNTER — Encounter (HOSPITAL_COMMUNITY): Payer: Self-pay | Admitting: Emergency Medicine

## 2014-04-30 ENCOUNTER — Emergency Department (HOSPITAL_COMMUNITY): Payer: Medicare Other

## 2014-04-30 DIAGNOSIS — R0602 Shortness of breath: Secondary | ICD-10-CM | POA: Diagnosis not present

## 2014-04-30 DIAGNOSIS — R42 Dizziness and giddiness: Secondary | ICD-10-CM | POA: Diagnosis not present

## 2014-04-30 DIAGNOSIS — E119 Type 2 diabetes mellitus without complications: Secondary | ICD-10-CM | POA: Insufficient documentation

## 2014-04-30 DIAGNOSIS — R05 Cough: Secondary | ICD-10-CM | POA: Diagnosis not present

## 2014-04-30 DIAGNOSIS — Z7982 Long term (current) use of aspirin: Secondary | ICD-10-CM | POA: Diagnosis not present

## 2014-04-30 DIAGNOSIS — Z87891 Personal history of nicotine dependence: Secondary | ICD-10-CM | POA: Diagnosis not present

## 2014-04-30 DIAGNOSIS — Z8739 Personal history of other diseases of the musculoskeletal system and connective tissue: Secondary | ICD-10-CM | POA: Diagnosis not present

## 2014-04-30 DIAGNOSIS — Z79899 Other long term (current) drug therapy: Secondary | ICD-10-CM | POA: Insufficient documentation

## 2014-04-30 DIAGNOSIS — I10 Essential (primary) hypertension: Secondary | ICD-10-CM | POA: Insufficient documentation

## 2014-04-30 DIAGNOSIS — I959 Hypotension, unspecified: Secondary | ICD-10-CM | POA: Diagnosis not present

## 2014-04-30 LAB — CBC
HCT: 35.5 % — ABNORMAL LOW (ref 36.0–46.0)
Hemoglobin: 11.6 g/dL — ABNORMAL LOW (ref 12.0–15.0)
MCH: 26.8 pg (ref 26.0–34.0)
MCHC: 32.7 g/dL (ref 30.0–36.0)
MCV: 82 fL (ref 78.0–100.0)
PLATELETS: 208 10*3/uL (ref 150–400)
RBC: 4.33 MIL/uL (ref 3.87–5.11)
RDW: 17.5 % — AB (ref 11.5–15.5)
WBC: 12 10*3/uL — AB (ref 4.0–10.5)

## 2014-04-30 LAB — BASIC METABOLIC PANEL
Anion gap: 14 (ref 5–15)
BUN: 20 mg/dL (ref 6–23)
CO2: 19 mmol/L (ref 19–32)
Calcium: 9.3 mg/dL (ref 8.4–10.5)
Chloride: 106 mmol/L (ref 96–112)
Creatinine, Ser: 1.06 mg/dL (ref 0.50–1.10)
GFR, EST AFRICAN AMERICAN: 62 mL/min — AB (ref >90)
GFR, EST NON AFRICAN AMERICAN: 53 mL/min — AB (ref >90)
GLUCOSE: 149 mg/dL — AB (ref 70–99)
POTASSIUM: 3.8 mmol/L (ref 3.5–5.1)
Sodium: 139 mmol/L (ref 135–145)

## 2014-04-30 LAB — D-DIMER, QUANTITATIVE (NOT AT ARMC): D-Dimer, Quant: 0.56 ug/mL-FEU — ABNORMAL HIGH (ref 0.00–0.48)

## 2014-04-30 LAB — I-STAT TROPONIN, ED: TROPONIN I, POC: 0 ng/mL (ref 0.00–0.08)

## 2014-04-30 MED ORDER — IOHEXOL 350 MG/ML SOLN
100.0000 mL | Freq: Once | INTRAVENOUS | Status: AC | PRN
  Administered 2014-04-30: 80 mL via INTRAVENOUS

## 2014-04-30 MED ORDER — SODIUM CHLORIDE 0.9 % IV BOLUS (SEPSIS)
1000.0000 mL | INTRAVENOUS | Status: AC
  Administered 2014-04-30: 1000 mL via INTRAVENOUS

## 2014-04-30 MED ORDER — SODIUM CHLORIDE 0.9 % IV SOLN
Freq: Once | INTRAVENOUS | Status: AC
  Administered 2014-04-30: 03:00:00 via INTRAVENOUS

## 2014-04-30 MED ORDER — SODIUM CHLORIDE 0.9 % IV SOLN
1.0000 g | Freq: Once | INTRAVENOUS | Status: AC
  Administered 2014-04-30: 1 g via INTRAVENOUS
  Filled 2014-04-30: qty 10

## 2014-04-30 NOTE — ED Notes (Signed)
Pt arrives with c/o sudden onset SOB this evening after taking Cardizem, states she was seen in ED with SVT over the weekend. EMS initial BP 70 palpated. Last set 110/50. HR in the 50s. Diaphoresis, SHOB, dizziness, lightheadedness, nausea but no vomiting. A/ox4

## 2014-04-30 NOTE — ED Provider Notes (Signed)
CSN: [id number]     Arrival date & time 04/30/14  0059 History   First MD Initiated Contact with Patient 04/30/14 0121     This chart was scribed for Purvis Sheffield, MD by Arlan Organ, ED Scribe. This patient was seen in room A06C/A06C and the patient's care was started 1:33 AM.   Chief Complaint  Patient presents with  . Shortness of Breath  . Hypotension   Patient is a 64 y.o. female presenting with shortness of breath. The history is provided by the patient. No language interpreter was used.  Shortness of Breath Severity:  Moderate Onset quality:  Sudden Duration:  3 hours Timing:  Constant Progression:  Unchanged Chronicity:  New Relieved by:  None tried Worsened by:  Nothing tried Ineffective treatments:  None tried Associated symptoms: cough   Associated symptoms: no abdominal pain, no chest pain, no fever, no headaches, no neck pain, no rash, no sore throat and no vomiting     HPI Comments: Samanthan Dugo is a 64 y.o. female with a PMHx of SVT diagnosed 2009, SLE, implanted loop recorder, and DM, who presents to the Emergency Department complaining of constant, sudden onset shortness of breath onset 11 PM this evening that woke her from sleep. She also reports nausea and lightheadedness. Pt denies symptoms feelings similar to SVT. Pt also mentions sharp L sided lower back pain that came on with SOB lasting for approximately 1 hour. However, pain has now resolved. Ms. Nebergall was recently started on Diltiazem 120 mg and took her first dosage this evening before going to bed. She denies any palpitations. No recent fever or chills. No history of blood clots. No history of cancer. No recent surgeries.  Past Medical History  Diagnosis Date  . SVT (supraventricular tachycardia)   . SLE (systemic lupus erythematosus)   . Essential hypertension   . Diabetes mellitus, type 2   . S/P radiofrequency ablation operation for arrhythmia    Past Surgical History  Procedure Laterality  Date  . Appendectomy    . Abdominal hysterectomy    . Hernia repair    . Esophageal dilation     Family History  Problem Relation Age of Onset  . Colon cancer Mother   . Leukemia Sister    History  Substance Use Topics  . Smoking status: Former Smoker    Types: Cigarettes  . Smokeless tobacco: Not on file  . Alcohol Use: No   OB History    No data available     Review of Systems  Constitutional: Negative for fever and chills.  HENT: Negative for rhinorrhea and sore throat.   Eyes: Negative for visual disturbance.  Respiratory: Positive for cough and shortness of breath.   Cardiovascular: Negative for chest pain and leg swelling.  Gastrointestinal: Negative for nausea, vomiting, abdominal pain and diarrhea.  Genitourinary: Negative for dysuria.  Musculoskeletal: Negative for back pain and neck pain.  Skin: Negative for rash.  Neurological: Positive for light-headedness. Negative for dizziness and headaches.  Hematological: Does not bruise/bleed easily.  Psychiatric/Behavioral: Negative for confusion.      Allergies  Ace inhibitors; Amlodipine; Codeine; Isosorbide; Metformin and related; Tramadol; and Nexium  Home Medications   Prior to Admission medications   Medication Sig Start Date End Date Taking? Authorizing Provider  acetaminophen (TYLENOL) 500 MG tablet Take by mouth. 02/13/13   Historical Provider, MD  ammonium lactate (LAC-HYDRIN) 12 % lotion Apply to both feet BID  Indications: Abnormal Dryness of Skin, Eyes or Mucous Membranes  03/04/13   Historical Provider, MD  aspirin 81 MG EC tablet Take by mouth. 11/20/12   Historical Provider, MD  atorvastatin (LIPITOR) 40 MG tablet Take by mouth. 02/13/13   Historical Provider, MD  felodipine (PLENDIL) 10 MG 24 hr tablet Take by mouth. 04/04/13   Historical Provider, MD  losartan-hydrochlorothiazide (HYZAAR) 100-25 MG per tablet Take by mouth. 05/21/13   Historical Provider, MD  metoprolol (LOPRESSOR) 100 MG tablet Take 1  tablet (100 mg total) by mouth 2 (two) times daily. 04/27/14   Zannie CovePreetha Joseph, MD  omeprazole (PRILOSEC) 40 MG capsule Take by mouth. 03/28/13   Historical Provider, MD  polyethylene glycol (MIRALAX / GLYCOLAX) packet Take by mouth. 04/30/13   Historical Provider, MD  prednisoLONE 5 MG TABS tablet Take by mouth.    Historical Provider, MD  pregabalin (LYRICA) 75 MG capsule Take by mouth. 04/23/13   Historical Provider, MD   Triage Vitals: BP 102/47 mmHg  Pulse 53  Temp(Src) 98.6 F (37 C) (Oral)  Resp 24  Ht 5\' 4"  (1.626 m)  Wt 212 lb (96.163 kg)  BMI 36.37 kg/m2  SpO2 100%   Physical Exam  Constitutional: She is oriented to person, place, and time. She appears well-developed and well-nourished. No distress.  HENT:  Head: Normocephalic and atraumatic.  Right Ear: Hearing normal.  Left Ear: Hearing normal.  Nose: Nose normal.  Mouth/Throat: Oropharynx is clear and moist and mucous membranes are normal.  Eyes: Conjunctivae and EOM are normal. Pupils are equal, round, and reactive to light.  Neck: Normal range of motion. Neck supple.  Cardiovascular: Regular rhythm, S1 normal and S2 normal.  Exam reveals no gallop and no friction rub.   No murmur heard. Pulmonary/Chest: Effort normal and breath sounds normal. No respiratory distress. She exhibits no tenderness.  Abdominal: Soft. Normal appearance and bowel sounds are normal. There is no hepatosplenomegaly. There is no tenderness. There is no rebound, no guarding, no tenderness at McBurney's point and negative Murphy's sign. No hernia.  Musculoskeletal: Normal range of motion.  Symmetric lower extremities without tenderness  Neurological: She is alert and oriented to person, place, and time. She has normal strength. No cranial nerve deficit or sensory deficit. Coordination normal. GCS eye subscore is 4. GCS verbal subscore is 5. GCS motor subscore is 6.  Skin: Skin is warm, dry and intact. No rash noted. No cyanosis.  Psychiatric: She has a  normal mood and affect. Her speech is normal and behavior is normal. Thought content normal.  Nursing note and vitals reviewed.   ED Course  Procedures (including critical care time)  DIAGNOSTIC STUDIES: Oxygen Saturation is 94% on RA , Normal by my interpretation.    COORDINATION OF CARE: 1:52 AM-Discussed treatment plan with pt at bedside and pt agreed to plan.     Labs Review Labs Reviewed  BASIC METABOLIC PANEL - Abnormal; Notable for the following:    Glucose, Bld 149 (*)    GFR calc non Af Amer 53 (*)    GFR calc Af Amer 62 (*)    All other components within normal limits  CBC - Abnormal; Notable for the following:    WBC 12.0 (*)    Hemoglobin 11.6 (*)    HCT 35.5 (*)    RDW 17.5 (*)    All other components within normal limits  D-DIMER, QUANTITATIVE - Abnormal; Notable for the following:    D-Dimer, Quant 0.56 (*)    All other components within normal limits  I-STAT TROPOININ,  ED    Imaging Review Dg Chest 2 View  04/30/2014   CLINICAL DATA:  Sudden onset of constant shortness of breath for 3 hr, awakening from sleep.  EXAM: CHEST  2 VIEW  COMPARISON:  04/26/2014  FINDINGS: Cardiomediastinal contours are unchanged, heart at the upper limits normal in size. Mild atherosclerosis of the thoracic aorta. Implanted monitor seen anteriorly in the chest. Pulmonary vasculature is normal. Linear atelectasis in both lower lobes, left greater than right. No consolidation, pleural effusion, or pneumothorax. No acute osseous abnormalities are seen.  IMPRESSION: Linear bibasilar atelectasis, otherwise no acute pulmonary process.   Electronically Signed   By: Rubye Oaks M.D.   On: 04/30/2014 02:43     EKG Interpretation   Date/Time:  Thursday April 30 2014 01:03:27 EDT Ventricular Rate:  55 PR Interval:  173 QRS Duration: 105 QT Interval:  485 QTC Calculation: 464 R Axis:   73 Text Interpretation:  Sinus rhythm No significant change since last  tracing Confirmed by  Mamta Rimmer  MD, Etheleen Valtierra ([id number]) on 04/30/2014 1:14:26 AM     Procedure note: Ultrasound Guided Peripheral IV Ultrasound guided 20 g peripheral 1.88 inch angiocath IV placement performed by me. Indications: Nursing unable to place IV. Details: The left antecubital fossa and upper arm was evaluated with a multifrequency linear probe. Several patent brachial veins are noted. 1 attempts were made to cannulate a antecubital vein under realtime US guidance with successful cannulation of the vein and catheter placement. There is return of non-pulsatile dark red blood. The patient tolerated the procedure well without complications.    MDM   Final diagnoses:  SOB (shortness of breath)    4:50 AM 64 y.o. female w hx of SVT s/p recent admission for obs after presenting with shortness of breath who presents with sudden onset shortness of breath which began this evening while lying in bed. She also had some left posterior rib pain which she has had previously. She denies that now on exam but continues to have some mild shortness of breath. She also has a soft blood pressure on initial vital signs although other vital signs are unremarkable. She states that she started a new medication this evening, diltiazem. She saw her cardiologist in [facility] who prescribed it in addition to her metoprolol 100 mg twice a day. She is also on Hyzaar. We'll get screening labs including d-dimer. Of note she did have a negative d-dimer several days ago. I suspect that her symptoms and blood pressure may be related to the new medication diltiazem in combination with metoprolol. Will give calcium gluc.   5:50 AM: I interpreted/reviewed the labs and/or imaging which were non-contributory. The patient's blood pressure has increased appropriately with IV fluids. She was also given calcium given the suspicion that her symptoms were related to the new medication diltiazem. She continues to appear well on exam and denies any chest pain. Do not  think this is ACS. Will recommend she discontinue diltiazem for now and discuss this with her cardiologist.  I have discussed the diagnosis/risks/treatment options with the patient and believe the pt to be eligible for discharge home to follow-up with her cardiologist. We also discussed returning to the ED immediately if new or worsening sx occur. We discussed the sx which are most concerning (e.g., cp, sob, fever) that necessitate immediate return. Medications administered to the patient during their visit and any new prescriptions provided to the patient are listed below.  Medications given during this visit Medications  sodium  chloride 0.9 % bolus 1,000 mL (0 mLs Intravenous Stopped 04/30/14 0326)  calcium gluconate 1 g in sodium chloride 0.9 % 100 mL IVPB (0 g Intravenous Stopped 04/30/14 0357)  0.9 %  sodium chloride infusion (0 mLs Intravenous Stopped 04/30/14 0443)  iohexol (OMNIPAQUE) 350 MG/ML injection 100 mL (80 mLs Intravenous Contrast Given 04/30/14 0447)    New Prescriptions   No medications on file       I personally performed the services described in this documentation, which was scribed in my presence. The recorded information has been reviewed and is accurate.    Purvis Sheffield, MD 04/30/14 [phone]

## 2014-04-30 NOTE — ED Notes (Signed)
IV team able to get IV access, but unable to draw labs. Phlebotomy notified.

## 2014-04-30 NOTE — ED Notes (Signed)
IV Team at bedside 

## 2014-04-30 NOTE — ED Notes (Signed)
MD Romeo AppleHarrison at beside.

## 2014-04-30 NOTE — ED Notes (Signed)
Patient voiced understanding to follow up with MD per discharge instructions and to discontinue the medication as instructed until followed up with cardiologist. Daughter is to pick up patient. Wheeled to lobby.

## 2014-04-30 NOTE — ED Notes (Signed)
MD Harrison at bedside. 

## 2014-04-30 NOTE — ED Notes (Signed)
Attempted IV access x2, unsuccessful.

## 2014-04-30 NOTE — Discharge Instructions (Signed)
Shortness of Breath °Shortness of breath means you have trouble breathing. Shortness of breath needs medical care right away. °HOME CARE  °· Do not smoke. °· Avoid being around chemicals or things (paint fumes, dust) that may bother your breathing. °· Rest as needed. Slowly begin your normal activities. °· Only take medicines as told by your doctor. °· Keep all doctor visits as told. °GET HELP RIGHT AWAY IF:  °· Your shortness of breath gets worse. °· You feel lightheaded, pass out (faint), or have a cough that is not helped by medicine. °· You cough up blood. °· You have pain with breathing. °· You have pain in your chest, arms, shoulders, or belly (abdomen). °· You have a fever. °· You cannot walk up stairs or exercise the way you normally do. °· You do not get better in the time expected. °· You have a hard time doing normal activities even with rest. °· You have problems with your medicines. °· You have any new symptoms. °MAKE SURE YOU: °· Understand these instructions. °· Will watch your condition. °· Will get help right away if you are not doing well or get worse. °Document Released: 07/05/2007 Document Revised: 01/21/2013 Document Reviewed: 04/03/2011 °ExitCare® Patient Information ©2015 [employer]. This information is not intended to replace advice given to you by your health care provider. Make sure you discuss any questions you have with your health care provider. ° °

## 2014-04-30 NOTE — ED Notes (Signed)
Pt returned from xray

## 2014-04-30 NOTE — ED Notes (Signed)
Scott from [employer] called. States loop recorder was last checked on March 27. States rate is about 55 with no high or low periods. States that record is being faxed.

## 2014-05-03 NOTE — Discharge Summary (Signed)
Physician Discharge Summary  Anna SimasLillian Lester WUJ:[id number] DOB: 1950-05-18 DOA: 04/26/2014  PCP: Rocky MorelOSTAND, ROBERT, MD  Admit date: 04/26/2014 Discharge date: 04/27/2014  Time spent: 45 minutes  Recommendations for Outpatient Follow-up:  [facility]. [facility] office will call with appointment, please FU TSh and  free T4 2. PCP in 1 week, Please FU [facility] and needs management of DM  Discharge Diagnoses:  Principal Problem:   SVT (supraventricular tachycardia) Active Problems:   Abnormal EKG   Chest pain   SLE (systemic lupus erythematosus)   Essential hypertension   Diabetes mellitus, type 2   S/P radiofrequency ablation operation for arrhythmia   Discharge Condition:stable  Diet recommendation: heart healthy  Filed Weights   04/26/14 1908 04/27/14 0112  Weight: 98.431 kg (217 lb) 98 kg (216 lb 0.8 oz)    History of present illness:  Anna SimasLillian Barnaby is a 64 y.o. female with Past medical history of SVT, SLE, hypertension, diabetes mellitus, failed attempt for radiofrequency ablation for SVT, loop recorder. The patient preseneted with complaints of palpitation, she knows when she has an episode of SVT and 3/27after working in yard later on in the afternoon she started having palpitations.  Hospital Course:   PSVT - doing well currently. Metoprolol. [place] [facility] [place] [facility] [place], had ablation in 2010 or 2011, Stress test last year reportedly normal. Loop in place.  -Since admission no further symptoms and HR in 80s and stable -Seen by Cardiology in consultation, It was recommended for now to continue with metoprolol 100 BID and to have EP evaluation as outpatient given her current overall stability and chronicity of symptoms. She was comfortable with this plan. -TSH and T4 pending and will need FU.  - Understands risk of pacemaker with further EPS/ablation. [place] this discussion [place]  - Dr.Skains spoke to Trish who will have EP office give her a  call to establish timely consult as outpatient.   2) h/o SLE - on low dose Prednisone  3) HTN essential - stable  4) DM - not taking any meds, CBGs in low 100s inpatient, HbA1c pending, please FU this up    Consultations:  cardiology  Discharge Exam: Filed Vitals:   04/27/14 1122  BP: 118/62  Pulse:   Temp:   Resp:     General:AAOx3 Cardiovascular: S1S2/RRR Respiratory: CTAB  Discharge Instructions   Discharge Instructions    Diet Carb Modified    Complete by:  As directed      Increase activity slowly    Complete by:  As directed           Discharge Medication List as of 04/27/2014 11:46 AM    CONTINUE these medications which have CHANGED   Details  metoprolol (LOPRESSOR) 100 MG tablet Take 1 tablet (100 mg total) by mouth 2 (two) times daily., Starting 04/27/2014, Until Discontinued, No Print      CONTINUE these medications which have NOT CHANGED   Details  acetaminophen (TYLENOL) 500 MG tablet Take by mouth., Starting 02/13/2013, Until Discontinued, Historical Med    ammonium lactate (LAC-HYDRIN) 12 % lotion Apply to both feet BID  Indications: Abnormal Dryness of Skin, Eyes or Mucous Membranes, Starting 03/04/2013, Until Discontinued, Historical Med    aspirin 81 MG EC tablet Take by mouth., Starting 11/20/2012, Until Discontinued, Historical Med    atorvastatin (LIPITOR) 40 MG tablet Take by mouth., Starting 02/13/2013, Until Discontinued, Historical Med    felodipine (PLENDIL) 10 MG 24 hr tablet Take  by mouth., Starting 04/04/2013, Until Discontinued, Historical Med    losartan-hydrochlorothiazide (HYZAAR) 100-25 MG per tablet Take by mouth., Starting 05/21/2013, Until Discontinued, Historical Med    omeprazole (PRILOSEC) 40 MG capsule Take by mouth., Starting 03/28/2013, Until Discontinued, Historical Med    polyethylene glycol (MIRALAX / GLYCOLAX) packet Take by mouth., Starting 04/30/2013, Until Discontinued, Historical Med    prednisoLONE 5 MG TABS tablet  Take by mouth., Until Discontinued, Historical Med    pregabalin (LYRICA) 75 MG capsule Take by mouth., Starting 04/23/2013, Until Discontinued, Historical Med      STOP taking these medications     glimepiride (AMARYL) 4 MG tablet        Allergies  Allergen Reactions  . Ace Inhibitors Other (See Comments)    cough  . Amlodipine Swelling  . Codeine Nausea Only  . Isosorbide Other (See Comments)    dizziness  . Metformin And Related Diarrhea  . Tramadol Nausea Only  . Nexium [Esomeprazole] Rash   Follow-up Information    Follow up with Rocky Morel, MD. Schedule an appointment as soon as possible for a visit in 1 week.   Specialty:  Internal Medicine   Why:  need alternate agent for diabetes like metformin, depending on HbA1C and blood sugars   Contact information:   [address] [place] [address] [phone]       Follow up with [facility].   Why:  office will call you with EP follow up   Contact information:   [address], [place], [place] [address] [phone]       The results of significant diagnostics from this hospitalization (including imaging, microbiology, ancillary and laboratory) are listed below for reference.    Significant Diagnostic Studies: Dg Chest 2 View  04/30/2014   CLINICAL DATA:  Sudden onset of constant shortness of breath for 3 hr, awakening from sleep.  EXAM: CHEST  2 VIEW  COMPARISON:  04/26/2014  FINDINGS: Cardiomediastinal contours are unchanged, heart at the upper limits normal in size. Mild atherosclerosis of the thoracic aorta. Implanted monitor seen anteriorly in the chest. Pulmonary vasculature is normal. Linear atelectasis in both lower lobes, left greater than right. No consolidation, pleural effusion, or pneumothorax. No acute osseous abnormalities are seen.  IMPRESSION: Linear bibasilar atelectasis, otherwise no acute pulmonary process.   Electronically Signed   By: Rubye Oaks M.D.   On:  04/30/2014 02:43   Ct Angio Chest Pe W/cm &/or Wo Cm  04/30/2014   CLINICAL DATA:  Acute onset of shortness of breath, with dizziness and lightheadedness. Initial encounter.  EXAM: CT ANGIOGRAPHY CHEST WITH CONTRAST  TECHNIQUE: Multidetector CT imaging of the chest was performed using the standard protocol during bolus administration of intravenous contrast. Multiplanar CT image reconstructions and MIPs were obtained to evaluate the vascular anatomy.  CONTRAST:  80mL OMNIPAQUE IOHEXOL 350 MG/ML SOLN  COMPARISON:  Chest radiograph performed earlier today at 1:56 a.m.  FINDINGS: There is no evidence of pulmonary embolus.  Mild patchy bilateral airspace opacities are seen, with associated interstitial prominence, raising concern for minimal interstitial edema. There is no evidence of pleural effusion or pneumothorax. No masses are identified; no abnormal focal contrast enhancement is seen.  The mediastinum is unremarkable in appearance. No mediastinal lymphadenopathy is seen. No pericardial effusion is identified. Mild calcification is noted along the aortic arch. The great vessels are grossly unremarkable in appearance, aside from mild calcification along the proximal right subclavian artery. Scattered coronary artery  calcification is seen. No axillary lymphadenopathy is seen. The thyroid gland is unremarkable in appearance.  The visualized portions of the liver and spleen are unremarkable. The patient is status post cholecystectomy, with clips noted along the gallbladder fossa. Scattered postoperative change is seen overlying the greater curvature of the stomach. An apparent 1.8 cm cyst is noted at the upper pole of the left kidney.  No acute osseous abnormalities are seen. A metallic device is noted along the medial left chest wall. Mild degenerative change is noted at the lower cervical spine.  Review of the MIP images confirms the above findings.  IMPRESSION: 1. No evidence of pulmonary embolus. 2. Mild patchy  bilateral airspace opacities, with associated interstitial prominence, raising concern for minimal interstitial edema. 3. Scattered coronary artery calcifications seen. 4. Small left renal cyst noted.   Electronically Signed   By: Roanna Raider M.D.   On: 04/30/2014 05:13   Dg Chest Portable 1 View  04/26/2014   CLINICAL DATA:  Shortness of Breath  EXAM: PORTABLE CHEST - 1 VIEW  COMPARISON:  08/29/2008  FINDINGS: Cardiac shadow is stable. The lungs are clear bilaterally. A monitoring device is noted over the anterior midline. The bony structures are within normal limits.  IMPRESSION: No acute abnormality noted.   Electronically Signed   By: Alcide Clever M.D.   On: 04/26/2014 20:06    Microbiology: No results found for this or any previous visit (from the past 240 hour(s)).   Labs: Basic Metabolic Panel:  Recent Labs Lab 04/26/14 1946 04/26/14 2322 04/27/14 0440 04/30/14 0145  NA 137  --  137 139  K 3.3*  --  3.8 3.8  CL 101  --  102 106  CO2 25  --  25 19  GLUCOSE 139*  --  101* 149*  BUN 18  --  18 20  CREATININE 0.98  --  0.89 1.06  CALCIUM 9.6  --  9.3 9.3  MG  --  2.1  --   --    Liver Function Tests:  Recent Labs Lab 04/26/14 2322 04/27/14 0440  AST 28 23  ALT 22 19  ALKPHOS 132* 107  BILITOT 0.2* 0.5  PROT 7.8 6.4  ALBUMIN 4.0 3.4*    Recent Labs Lab 04/26/14 2322  LIPASE 33   No results for input(s): AMMONIA in the last 168 hours. CBC:  Recent Labs Lab 04/26/14 1946 04/27/14 0440 04/30/14 0145  WBC 10.5 9.8 12.0*  NEUTROABS 6.7 5.5  --   HGB 12.4 11.9* 11.6*  HCT 38.2 37.0 35.5*  MCV 81.8 81.3 82.0  PLT 228 203 208   Cardiac Enzymes:  Recent Labs Lab 04/26/14 2322 04/27/14 0440  TROPONINI 0.03 <0.03   BNP: BNP (last 3 results)  Recent Labs  04/26/14 1946  BNP 51.7    ProBNP (last 3 results) No results for input(s): PROBNP in the last 8760 hours.  CBG:  Recent Labs Lab 04/27/14 0028 04/27/14 0609 04/27/14 1128  GLUCAP  109* 101* 119*       Signed:  Deeric Cruise  [facility] 05/03/2014, 6:19 PM

## 2014-05-04 ENCOUNTER — Encounter: Payer: Medicare Other | Admitting: Internal Medicine

## 2014-05-11 ENCOUNTER — Emergency Department (HOSPITAL_BASED_OUTPATIENT_CLINIC_OR_DEPARTMENT_OTHER)
Admission: EM | Admit: 2014-05-11 | Discharge: 2014-05-11 | Disposition: A | Discharge status: Home / Self Care | Payer: Medicare Other | Attending: Emergency Medicine | Admitting: Emergency Medicine

## 2014-05-11 ENCOUNTER — Encounter (HOSPITAL_BASED_OUTPATIENT_CLINIC_OR_DEPARTMENT_OTHER): Payer: Self-pay | Admitting: *Deleted

## 2014-05-11 ENCOUNTER — Emergency Department (HOSPITAL_BASED_OUTPATIENT_CLINIC_OR_DEPARTMENT_OTHER): Payer: Medicare Other

## 2014-05-11 DIAGNOSIS — Z7982 Long term (current) use of aspirin: Secondary | ICD-10-CM | POA: Insufficient documentation

## 2014-05-11 DIAGNOSIS — Z79899 Other long term (current) drug therapy: Secondary | ICD-10-CM | POA: Insufficient documentation

## 2014-05-11 DIAGNOSIS — M3210 Systemic lupus erythematosus, organ or system involvement unspecified: Secondary | ICD-10-CM | POA: Insufficient documentation

## 2014-05-11 DIAGNOSIS — I10 Essential (primary) hypertension: Secondary | ICD-10-CM | POA: Diagnosis not present

## 2014-05-11 DIAGNOSIS — Y92009 Unspecified place in unspecified non-institutional (private) residence as the place of occurrence of the external cause: Secondary | ICD-10-CM | POA: Insufficient documentation

## 2014-05-11 DIAGNOSIS — W1839XA Other fall on same level, initial encounter: Secondary | ICD-10-CM | POA: Insufficient documentation

## 2014-05-11 DIAGNOSIS — S63502A Unspecified sprain of left wrist, initial encounter: Secondary | ICD-10-CM

## 2014-05-11 DIAGNOSIS — Z9889 Other specified postprocedural states: Secondary | ICD-10-CM | POA: Diagnosis not present

## 2014-05-11 DIAGNOSIS — Y9389 Activity, other specified: Secondary | ICD-10-CM | POA: Diagnosis not present

## 2014-05-11 DIAGNOSIS — Y998 Other external cause status: Secondary | ICD-10-CM | POA: Insufficient documentation

## 2014-05-11 DIAGNOSIS — E119 Type 2 diabetes mellitus without complications: Secondary | ICD-10-CM | POA: Diagnosis not present

## 2014-05-11 DIAGNOSIS — S6992XA Unspecified injury of left wrist, hand and finger(s), initial encounter: Secondary | ICD-10-CM | POA: Diagnosis present

## 2014-05-11 NOTE — ED Provider Notes (Signed)
CSN: [id number]     Arrival date & time 05/11/14  1228 History   First MD Initiated Contact with Patient 05/11/14 1327     Chief Complaint  Patient presents with  . Fall     (Consider location/radiation/quality/duration/timing/severity/associated sxs/prior Treatment) HPI Comments: Patient is a 64 year old female with history of SVT and hypertension and diabetes. She presents for evaluation of left wrist pain. She fell at her daughter's house this morning.  Patient is a 64 y.o. female presenting with fall. The history is provided by the patient.  Fall This is a new problem. The current episode started 1 to 2 hours ago. The problem occurs constantly. The problem has not changed since onset.Exacerbated by: Movement and palpation. The symptoms are relieved by rest. She has tried nothing for the symptoms. The treatment provided no relief.    Past Medical History  Diagnosis Date  . SVT (supraventricular tachycardia)   . SLE (systemic lupus erythematosus)   . Essential hypertension   . Diabetes mellitus, type 2   . S/P radiofrequency ablation operation for arrhythmia    Past Surgical History  Procedure Laterality Date  . Appendectomy    . Abdominal hysterectomy    . Hernia repair    . Esophageal dilation     Family History  Problem Relation Age of Onset  . Colon cancer Mother   . Leukemia Sister    History  Substance Use Topics  . Smoking status: Former Smoker    Types: Cigarettes  . Smokeless tobacco: Not on file  . Alcohol Use: No   OB History    No data available     Review of Systems  All other systems reviewed and are negative.     Allergies  Ace inhibitors; Amlodipine; Codeine; Isosorbide; Metformin and related; Tramadol; and Nexium  Home Medications   Prior to Admission medications   Medication Sig Start Date End Date Taking? Authorizing Provider  acetaminophen (TYLENOL) 500 MG tablet Take by mouth. 02/13/13   Historical Provider, MD  ammonium lactate  (LAC-HYDRIN) 12 % lotion Apply to both feet BID  Indications: Abnormal Dryness of Skin, Eyes or Mucous Membranes 03/04/13   Historical Provider, MD  aspirin 81 MG EC tablet Take by mouth. 11/20/12   Historical Provider, MD  atorvastatin (LIPITOR) 40 MG tablet Take by mouth. 02/13/13   Historical Provider, MD  felodipine (PLENDIL) 10 MG 24 hr tablet Take by mouth. 04/04/13   Historical Provider, MD  losartan-hydrochlorothiazide (HYZAAR) 100-25 MG per tablet Take by mouth. 05/21/13   Historical Provider, MD  metoprolol (LOPRESSOR) 100 MG tablet Take 1 tablet (100 mg total) by mouth 2 (two) times daily. 04/27/14   Zannie Cove, MD  omeprazole (PRILOSEC) 40 MG capsule Take by mouth. 03/28/13   Historical Provider, MD  polyethylene glycol (MIRALAX / GLYCOLAX) packet Take by mouth. 04/30/13   Historical Provider, MD  prednisoLONE 5 MG TABS tablet Take by mouth.    Historical Provider, MD  pregabalin (LYRICA) 75 MG capsule Take by mouth. 04/23/13   Historical Provider, MD   BP 175/79 mmHg  Pulse 83  Temp(Src) 98.8 F (37.1 C) (Oral)  Resp 18  Ht  (1.626 m)  Wt 210 lb (95.255 kg)  BMI 36.03 kg/m2  SpO2 98% Physical Exam  Constitutional: She is oriented to person, place, and time. She appears well-developed and well-nourished. No distress.  HENT:  Head: Normocephalic and atraumatic.  Neck: Normal range of motion. Neck supple.  Musculoskeletal: Normal range of motion.  The left wrist is noted to have swelling and tenderness over the distal radius. Is no obvious deformity. She has pain with range of motion. Distal capillary refill, motor, and sensation are all intact.  Neurological: She is alert and oriented to person, place, and time.  Skin: Skin is warm and dry. She is not diaphoretic.  Nursing note and vitals reviewed.   ED Course  Procedures (including critical care time) Labs Review Labs Reviewed - No data to display  Imaging Review No results found.   EKG Interpretation None       MDM   Final diagnoses:  None    X-rays negative for fracture. We'll treat as a sprain with a splint and when necessary follow-up if not improving in the next week.    Geoffery Lyonsouglas Ladaysha Soutar, MD 05/13/14 [id number]

## 2014-05-11 NOTE — Discharge Instructions (Signed)
Wear wrist splint as applied for comfort for the next several days.  Ice for 20 minutes every 2 hours while awake for the next 2 days.  Follow-up with your primary Dr. if not improving in the next week.   Sprain A sprain is a tear in one of the strong, fibrous tissues that connect your bones (ligaments). The severity of the sprain depends on how much of the ligament is torn. The tear can be either partial or complete. CAUSES  Often, sprains are a result of a fall or an injury. The force of the impact causes the fibers of your ligament to stretch beyond their normal length. This excess tension causes the fibers of your ligament to tear. SYMPTOMS  You may have some loss of motion or increased pain within your normal range of motion. Other symptoms include:  Bruising.  Tenderness.  Swelling. DIAGNOSIS  In order to diagnose a sprain, your caregiver will physically examine you to determine how torn the ligament is. Your caregiver may also suggest an X-ray exam to make sure no bones are broken. TREATMENT  If your ligament is only partially torn, treatment usually involves keeping the injured area in a fixed position (immobilization) for a short period. To do this, your caregiver will apply a bandage, cast, or splint to keep the area from moving until it heals. For a partially torn ligament, the healing process usually takes 2 to 3 weeks. If your ligament is completely torn, you may need surgery to reconnect the ligament to the bone or to reconstruct the ligament. After surgery, a cast or splint may be applied and will need to stay on for 4 to 6 weeks while your ligament heals. HOME CARE INSTRUCTIONS  Keep the injured area elevated to decrease swelling.  To ease pain and swelling, apply ice to your joint twice a day, for 2 to 3 days.  Put ice in a plastic bag.  Place a towel between your skin and the bag.  Leave the ice on for 15 minutes.  Only take over-the-counter or prescription  medicine for pain as directed by your caregiver.  Do not leave the injured area unprotected until pain and stiffness go away (usually 3 to 4 weeks).  Do not allow your cast or splint to get wet. Cover your cast or splint with a plastic bag when you shower or bathe. Do not swim.  Your caregiver may suggest exercises for you to do during your recovery to prevent or limit permanent stiffness. SEEK IMMEDIATE MEDICAL CARE IF:  Your cast or splint becomes damaged.  Your pain becomes worse. MAKE SURE YOU:  Understand these instructions.  Will watch your condition.  Will get help right away if you are not doing well or get worse. Document Released: 01/14/2000 Document Revised: 04/10/2011 Document Reviewed: 01/28/2011 [facility] Patient Information 2015 [employer], [place]. This information is not intended to replace advice given to you by your health care provider. Make sure you discuss any questions you have with your health care provider.

## 2014-05-11 NOTE — ED Notes (Signed)
Patient transported to X-ray 

## 2014-05-11 NOTE — ED Notes (Signed)
Stepping up onto a step and she fell forward. Broke her fall with her left wrist. Pain in her wrist radiating up her arm.

## 2014-05-27 ENCOUNTER — Emergency Department (HOSPITAL_BASED_OUTPATIENT_CLINIC_OR_DEPARTMENT_OTHER): Payer: Medicare Other

## 2014-05-27 ENCOUNTER — Emergency Department (HOSPITAL_BASED_OUTPATIENT_CLINIC_OR_DEPARTMENT_OTHER)
Admission: EM | Admit: 2014-05-27 | Discharge: 2014-05-27 | Disposition: A | Discharge status: Home / Self Care | Payer: Medicare Other | Attending: Emergency Medicine | Admitting: Emergency Medicine

## 2014-05-27 ENCOUNTER — Encounter (HOSPITAL_BASED_OUTPATIENT_CLINIC_OR_DEPARTMENT_OTHER): Payer: Self-pay | Admitting: *Deleted

## 2014-05-27 DIAGNOSIS — Z87828 Personal history of other (healed) physical injury and trauma: Secondary | ICD-10-CM | POA: Diagnosis not present

## 2014-05-27 DIAGNOSIS — I10 Essential (primary) hypertension: Secondary | ICD-10-CM | POA: Insufficient documentation

## 2014-05-27 DIAGNOSIS — M25532 Pain in left wrist: Secondary | ICD-10-CM | POA: Diagnosis not present

## 2014-05-27 DIAGNOSIS — Z87891 Personal history of nicotine dependence: Secondary | ICD-10-CM | POA: Diagnosis not present

## 2014-05-27 DIAGNOSIS — Z79899 Other long term (current) drug therapy: Secondary | ICD-10-CM | POA: Insufficient documentation

## 2014-05-27 DIAGNOSIS — Z7982 Long term (current) use of aspirin: Secondary | ICD-10-CM | POA: Diagnosis not present

## 2014-05-27 DIAGNOSIS — E119 Type 2 diabetes mellitus without complications: Secondary | ICD-10-CM | POA: Diagnosis not present

## 2014-05-27 DIAGNOSIS — M79602 Pain in left arm: Secondary | ICD-10-CM | POA: Diagnosis present

## 2014-05-27 NOTE — ED Notes (Signed)
Patient transported to X-ray ambulatory with tech. 

## 2014-05-27 NOTE — ED Notes (Addendum)
Pt c/o left arm pain  seen here 2 weeks ago for same.  Xray done left before results

## 2014-05-27 NOTE — ED Provider Notes (Addendum)
CSN: [id number]     Arrival date & time 05/27/14  1723 History   First MD Initiated Contact with Patient 05/27/14 1730     Chief Complaint  Patient presents with  . Arm Injury     (Consider location/radiation/quality/duration/timing/severity/associated sxs/prior Treatment) Patient is a 64 y.o. female presenting with arm injury. The history is provided by the patient.  Arm Injury Location:  Wrist Time since incident:  2 weeks Injury: yes   Mechanism of injury: fall   Fall:    Fall occurred:  Tripped   Impact surface:  Hard floor   Point of impact:  Hands Wrist location:  L wrist Pain details:    Quality:  Aching, shooting and throbbing   Radiates to:  Does not radiate   Severity:  Moderate   Onset quality:  Sudden   Timing:  Constant   Progression:  Unchanged Chronicity:  New Handedness:  Right-handed Relieved by:  Nothing Worsened by:  Movement Associated symptoms: decreased range of motion and swelling     Past Medical History  Diagnosis Date  . SVT (supraventricular tachycardia)   . SLE (systemic lupus erythematosus)   . Essential hypertension   . Diabetes mellitus, type 2   . S/P radiofrequency ablation operation for arrhythmia    Past Surgical History  Procedure Laterality Date  . Appendectomy    . Abdominal hysterectomy    . Hernia repair    . Esophageal dilation     Family History  Problem Relation Age of Onset  . Colon cancer Mother   . Leukemia Sister    History  Substance Use Topics  . Smoking status: Former Smoker    Types: Cigarettes  . Smokeless tobacco: Not on file  . Alcohol Use: No   OB History    No data available     Review of Systems  All other systems reviewed and are negative.     Allergies  Ace inhibitors; Amlodipine; Codeine; Isosorbide; Metformin and related; Tramadol; and Nexium  Home Medications   Prior to Admission medications   Medication Sig Start Date End Date Taking? Authorizing Provider  acetaminophen  (TYLENOL) 500 MG tablet Take by mouth. 02/13/13   Historical Provider, MD  ammonium lactate (LAC-HYDRIN) 12 % lotion Apply to both feet BID  Indications: Abnormal Dryness of Skin, Eyes or Mucous Membranes 03/04/13   Historical Provider, MD  aspirin 81 MG EC tablet Take by mouth. 11/20/12   Historical Provider, MD  atorvastatin (LIPITOR) 40 MG tablet Take by mouth. 02/13/13   Historical Provider, MD  felodipine (PLENDIL) 10 MG 24 hr tablet Take by mouth. 04/04/13   Historical Provider, MD  losartan-hydrochlorothiazide (HYZAAR) 100-25 MG per tablet Take by mouth. 05/21/13   Historical Provider, MD  metoprolol (LOPRESSOR) 100 MG tablet Take 1 tablet (100 mg total) by mouth 2 (two) times daily. 04/27/14   Zannie CovePreetha Joseph, MD  omeprazole (PRILOSEC) 40 MG capsule Take by mouth. 03/28/13   Historical Provider, MD  polyethylene glycol (MIRALAX / GLYCOLAX) packet Take by mouth. 04/30/13   Historical Provider, MD  prednisoLONE 5 MG TABS tablet Take by mouth.    Historical Provider, MD  pregabalin (LYRICA) 75 MG capsule Take by mouth. 04/23/13   Historical Provider, MD   BP 192/79 mmHg  Pulse 99  Temp(Src) 99.2 F (37.3 C) (Oral)  Resp 18  Ht 5\' 4"  (1.626 m)  Wt 210 lb (95.255 kg)  BMI 36.03 kg/m2  SpO2 99% Physical Exam  Constitutional: She is oriented to  person, place, and time. She appears well-developed and well-nourished. No distress.  HENT:  Head: Normocephalic and atraumatic.  Eyes: EOM are normal. Pupils are equal, round, and reactive to light.  Cardiovascular: Normal rate.   Pulmonary/Chest: Effort normal.  Musculoskeletal:       Left wrist: She exhibits decreased range of motion, tenderness, bony tenderness and swelling.  Left snuffbox tenderness.  2+ radial pulse  Neurological: She is alert and oriented to person, place, and time.  Skin: Skin is warm and dry.  Psychiatric: She has a normal mood and affect. Her behavior is normal.  Nursing note and vitals reviewed.   ED Course  Procedures  (including critical care time) Labs Review Labs Reviewed - No data to display  Imaging Review Dg Wrist Complete Left  05/27/2014   CLINICAL DATA:  Fall 2 weeks ago, left wrist pain, subsequent encounter.  EXAM: LEFT WRIST - COMPLETE 3+ VIEW  COMPARISON:  05/11/2014.  FINDINGS: Linear density is again seen along the distal margin of the radial styloid without surrounding periosteal reaction, suggesting a chronic finding. No acute or healing fracture. Degenerative change is mild at the scaphoid trapezium trapezoid joint.  IMPRESSION: No acute findings.  STT joint osteoarthritis.   Electronically Signed   By: Leanna Battles M.D.   On: 05/27/2014 18:00     EKG Interpretation None      MDM   Final diagnoses:  Wrist pain, acute, left    Patient with a mechanical fall 2 weeks ago with persistent pain in the left wrist. She was seen 2 weeks ago with a negative x-ray and placed in a Velcro splint however she continues to have pain. Patient has significant snuffbox tenderness of the left wrist with mild swelling and pain with pronation of the wrist.  Will repeat x-ray to insure no scaphoid fracture that is now healing.  6:16 PM Plain film shows possible joint osteoarthritis however patient never had any pain in her arm until the fall 2 weeks ago. Suspect possibly underlying scaphoid injury. We'll place patient in a thumb spica splint and have her follow-up with hand surgery for further evaluation.  Gwyneth Sprout, MD 05/27/14 1610  Gwyneth Sprout, MD 05/27/14 [id number]

## 2014-06-25 ENCOUNTER — Other Ambulatory Visit: Payer: Self-pay

## 2014-06-25 ENCOUNTER — Encounter (HOSPITAL_BASED_OUTPATIENT_CLINIC_OR_DEPARTMENT_OTHER): Payer: Self-pay | Admitting: *Deleted

## 2014-06-25 ENCOUNTER — Emergency Department (HOSPITAL_BASED_OUTPATIENT_CLINIC_OR_DEPARTMENT_OTHER)
Admission: EM | Admit: 2014-06-25 | Discharge: 2014-06-25 | Disposition: A | Discharge status: Home / Self Care | Payer: Medicare Other | Attending: Emergency Medicine | Admitting: Emergency Medicine

## 2014-06-25 ENCOUNTER — Emergency Department (HOSPITAL_BASED_OUTPATIENT_CLINIC_OR_DEPARTMENT_OTHER): Payer: Medicare Other

## 2014-06-25 DIAGNOSIS — I10 Essential (primary) hypertension: Secondary | ICD-10-CM | POA: Insufficient documentation

## 2014-06-25 DIAGNOSIS — Z87891 Personal history of nicotine dependence: Secondary | ICD-10-CM | POA: Insufficient documentation

## 2014-06-25 DIAGNOSIS — R110 Nausea: Secondary | ICD-10-CM | POA: Diagnosis not present

## 2014-06-25 DIAGNOSIS — Z90710 Acquired absence of both cervix and uterus: Secondary | ICD-10-CM | POA: Diagnosis not present

## 2014-06-25 DIAGNOSIS — Z7982 Long term (current) use of aspirin: Secondary | ICD-10-CM | POA: Insufficient documentation

## 2014-06-25 DIAGNOSIS — I471 Supraventricular tachycardia: Secondary | ICD-10-CM | POA: Insufficient documentation

## 2014-06-25 DIAGNOSIS — Z9889 Other specified postprocedural states: Secondary | ICD-10-CM | POA: Insufficient documentation

## 2014-06-25 DIAGNOSIS — R197 Diarrhea, unspecified: Secondary | ICD-10-CM | POA: Diagnosis not present

## 2014-06-25 DIAGNOSIS — Z8739 Personal history of other diseases of the musculoskeletal system and connective tissue: Secondary | ICD-10-CM | POA: Diagnosis not present

## 2014-06-25 DIAGNOSIS — Z79899 Other long term (current) drug therapy: Secondary | ICD-10-CM | POA: Diagnosis not present

## 2014-06-25 DIAGNOSIS — Z9049 Acquired absence of other specified parts of digestive tract: Secondary | ICD-10-CM | POA: Insufficient documentation

## 2014-06-25 DIAGNOSIS — E119 Type 2 diabetes mellitus without complications: Secondary | ICD-10-CM | POA: Insufficient documentation

## 2014-06-25 DIAGNOSIS — R1011 Right upper quadrant pain: Secondary | ICD-10-CM

## 2014-06-25 LAB — COMPREHENSIVE METABOLIC PANEL
ALT: 21 U/L (ref 14–54)
AST: 26 U/L (ref 15–41)
Albumin: 4 g/dL (ref 3.5–5.0)
Alkaline Phosphatase: 131 U/L — ABNORMAL HIGH (ref 38–126)
Anion gap: 10 (ref 5–15)
BILIRUBIN TOTAL: 0.5 mg/dL (ref 0.3–1.2)
BUN: 19 mg/dL (ref 6–20)
CO2: 24 mmol/L (ref 22–32)
Calcium: 9.4 mg/dL (ref 8.9–10.3)
Chloride: 105 mmol/L (ref 101–111)
Creatinine, Ser: 0.89 mg/dL (ref 0.44–1.00)
GFR calc Af Amer: >60 mL/min (ref >60)
Glucose, Bld: 130 mg/dL — ABNORMAL HIGH (ref 65–99)
POTASSIUM: 3.4 mmol/L — AB (ref 3.5–5.1)
Sodium: 139 mmol/L (ref 135–145)
Total Protein: 7.9 g/dL (ref 6.5–8.1)

## 2014-06-25 LAB — URINALYSIS, ROUTINE W REFLEX MICROSCOPIC
BILIRUBIN URINE: NEGATIVE
Glucose, UA: NEGATIVE mg/dL
Ketones, ur: NEGATIVE mg/dL
NITRITE: NEGATIVE
Protein, ur: NEGATIVE mg/dL
Specific Gravity, Urine: 1.024 (ref 1.005–1.030)
Urobilinogen, UA: 0.2 mg/dL (ref 0.0–1.0)
pH: 5.5 (ref 5.0–8.0)

## 2014-06-25 LAB — CBC WITH DIFFERENTIAL/PLATELET
BASOS PCT: 0 % (ref 0–1)
Basophils Absolute: 0 10*3/uL (ref 0.0–0.1)
EOS PCT: 1 % (ref 0–5)
Eosinophils Absolute: 0.1 10*3/uL (ref 0.0–0.7)
HCT: 34.7 % — ABNORMAL LOW (ref 36.0–46.0)
HEMOGLOBIN: 11.3 g/dL — AB (ref 12.0–15.0)
LYMPHS ABS: 2 10*3/uL (ref 0.7–4.0)
Lymphocytes Relative: 21 % (ref 12–46)
MCH: 26.6 pg (ref 26.0–34.0)
MCHC: 32.6 g/dL (ref 30.0–36.0)
MCV: 81.6 fL (ref 78.0–100.0)
Monocytes Absolute: 0.5 10*3/uL (ref 0.1–1.0)
Monocytes Relative: 5 % (ref 3–12)
Neutro Abs: 7.2 10*3/uL (ref 1.7–7.7)
Neutrophils Relative %: 73 % (ref 43–77)
Platelets: 204 10*3/uL (ref 150–400)
RBC: 4.25 MIL/uL (ref 3.87–5.11)
RDW: 15.8 % — AB (ref 11.5–15.5)
WBC: 9.8 10*3/uL (ref 4.0–10.5)

## 2014-06-25 LAB — URINE MICROSCOPIC-ADD ON

## 2014-06-25 LAB — LIPASE, BLOOD: LIPASE: 32 U/L (ref 22–51)

## 2014-06-25 MED ORDER — ONDANSETRON 4 MG PO TBDP
4.0000 mg | ORAL_TABLET | Freq: Once | ORAL | Status: AC
  Administered 2014-06-25: 4 mg via ORAL
  Filled 2014-06-25: qty 1

## 2014-06-25 MED ORDER — ONDANSETRON 4 MG PO TBDP: 4.0000 mg | ORAL_TABLET | Freq: Three times a day (TID) | ORAL | Status: AC | PRN

## 2014-06-25 MED ORDER — MORPHINE SULFATE 4 MG/ML IJ SOLN
4.0000 mg | Freq: Once | INTRAMUSCULAR | Status: AC
  Administered 2014-06-25: 4 mg via INTRAMUSCULAR
  Filled 2014-06-25: qty 1

## 2014-06-25 MED ORDER — ONDANSETRON HCL 4 MG/2ML IJ SOLN: 4.0000 mg | Freq: Once | INTRAMUSCULAR | Status: DC

## 2014-06-25 NOTE — ED Notes (Signed)
Pt c/o right lower abd pain x 1 week nausea only

## 2014-06-25 NOTE — ED Notes (Signed)
PA at bedside.

## 2014-06-25 NOTE — ED Notes (Signed)
Patient transported to Ultrasound 

## 2014-06-25 NOTE — Discharge Instructions (Signed)
Abdominal Pain °Many things can cause abdominal pain. Usually, abdominal pain is not caused by a disease and will improve without treatment. It can often be observed and treated at home. Your health care provider will do a physical exam and possibly order blood tests and X-rays to help determine the seriousness of your pain. However, in many cases, more time must pass before a clear cause of the pain can be found. Before that point, your health care provider may not know if you need more testing or further treatment. °HOME CARE INSTRUCTIONS  °Monitor your abdominal pain for any changes. The following actions may help to alleviate any discomfort you are experiencing: °· Only take over-the-counter or prescription medicines as directed by your health care provider. °· Do not take laxatives unless directed to do so by your health care provider. °· Try a clear liquid diet (broth, tea, or water) as directed by your health care provider. Slowly move to a bland diet as tolerated. °SEEK MEDICAL CARE IF: °· You have unexplained abdominal pain. °· You have abdominal pain associated with nausea or diarrhea. °· You have pain when you urinate or have a bowel movement. °· You experience abdominal pain that wakes you in the night. °· You have abdominal pain that is worsened or improved by eating food. °· You have abdominal pain that is worsened with eating fatty foods. °· You have a fever. °SEEK IMMEDIATE MEDICAL CARE IF:  °· Your pain does not go away within 2 hours. °· You keep throwing up (vomiting). °· Your pain is felt only in portions of the abdomen, such as the right side or the left lower portion of the abdomen. °· You pass bloody or black tarry stools. °MAKE SURE YOU: °· Understand these instructions.   °· Will watch your condition.   °· Will get help right away if you are not doing well or get worse.   °Document Released: 10/26/2004 Document Revised: 01/21/2013 Document Reviewed: 09/25/2012 °Exit[employer]® Patient Information  ©2015 [employer]. This information is not intended to replace advice given to you by your health care provider. Make sure you discuss any questions you have with your health care provider. ° °Nausea, Adult °Nausea is the feeling that you have an upset stomach or have to vomit. Nausea by itself is not likely a serious concern, but it may be an early sign of more serious medical problems. As nausea gets worse, it can lead to vomiting. If vomiting develops, there is the risk of dehydration.  °CAUSES  °· Viral infections. °· Food poisoning. °· Medicines. °· Pregnancy. °· Motion sickness. °· Migraine headaches. °· Emotional distress. °· Severe pain from any source. °· Alcohol intoxication. °HOME CARE INSTRUCTIONS °· Get plenty of rest. °· Ask your caregiver about specific rehydration instructions. °· Eat small amounts of food and sip liquids more often. °· Take all medicines as told by your caregiver. °SEEK MEDICAL CARE IF: °· You have not improved after 2 days, or you get worse. °· You have a headache. °SEEK IMMEDIATE MEDICAL CARE IF:  °· You have a fever. °· You faint. °· You keep vomiting or have blood in your vomit. °· You are extremely weak or dehydrated. °· You have dark or bloody stools. °· You have severe chest or abdominal pain. °MAKE SURE YOU: °· Understand these instructions. °· Will watch your condition. °· Will get help right away if you are not doing well or get worse. °Document Released: 02/24/2004 Document Revised: 10/11/2011 Document Reviewed: 09/28/2010 °ExitCare® Patient Information ©2015   [employer]. This information is not intended to replace advice given to you by your health care provider. Make sure you discuss any questions you have with your health care provider. ° °

## 2014-06-25 NOTE — ED Notes (Signed)
Pt returned from [facility]

## 2014-06-25 NOTE — ED Provider Notes (Signed)
CSN: [id number]     Arrival date & time 06/25/14  1550 History   First MD Initiated Contact with Patient 06/25/14 1607     Chief Complaint  Patient presents with  . Abdominal Pain   Anna Lester is a 64 y.o. female with a history of diabetes, hypertension, appendectomy, hysterectomy, and cholecystectomy who presents to the emergency department complaining of right upper quadrant abdominal pain ongoing for the past week associated with nausea. Patient reports she is been having constant right upper quadrant abdominal pain ongoing for the past week that can be worse with movement. She complains of 10/10 RUQ pain. She has taken nothing for treatment. She denies any changes to her pain in relation to her eating. She reports nausea but no vomiting. She reports having diarrhea yesterday 4 times but none today. She would she's been eating normally. She reports a previous abdominal surgical history of an appendectomy, cholecystectomy, hysterectomy, and umbilical hernia repair. The patient denies fevers, chills, chest pain, shortness of breath, palpitations, urinary symptoms, hematuria, hematochezia, vomiting,  (Consider location/radiation/quality/duration/timing/severity/associated sxs/prior Treatment) HPI  Past Medical History  Diagnosis Date  . SVT (supraventricular tachycardia)   . SLE (systemic lupus erythematosus)   . Essential hypertension   . Diabetes mellitus, type 2   . S/P radiofrequency ablation operation for arrhythmia    Past Surgical History  Procedure Laterality Date  . Appendectomy    . Abdominal hysterectomy    . Hernia repair    . Esophageal dilation     Family History  Problem Relation Age of Onset  . Colon cancer Mother   . Leukemia Sister    History  Substance Use Topics  . Smoking status: Former Smoker    Types: Cigarettes  . Smokeless tobacco: Not on file  . Alcohol Use: No   OB History    No data available     Review of Systems  Constitutional: Negative  for fever and chills.  HENT: Negative for congestion and sore throat.   Eyes: Negative for visual disturbance.  Respiratory: Negative for cough, chest tightness, shortness of breath and wheezing.   Cardiovascular: Negative for chest pain and palpitations.  Gastrointestinal: Positive for nausea, abdominal pain and diarrhea. Negative for vomiting, constipation and blood in stool.  Genitourinary: Negative for dysuria, urgency, frequency, hematuria, flank pain and difficulty urinating.  Musculoskeletal: Negative for back pain and neck pain.  Skin: Negative for rash.  Neurological: Negative for light-headedness and headaches.      Allergies  Ace inhibitors; Amlodipine; Codeine; Isosorbide; Metformin and related; Tramadol; and Nexium  Home Medications   Prior to Admission medications   Medication Sig Start Date End Date Taking? Authorizing Provider  acetaminophen (TYLENOL) 500 MG tablet Take by mouth. 02/13/13   Historical Provider, MD  ammonium lactate (LAC-HYDRIN) 12 % lotion Apply to both feet BID  Indications: Abnormal Dryness of Skin, Eyes or Mucous Membranes 03/04/13   Historical Provider, MD  aspirin 81 MG EC tablet Take by mouth. 11/20/12   Historical Provider, MD  atorvastatin (LIPITOR) 40 MG tablet Take by mouth. 02/13/13   Historical Provider, MD  felodipine (PLENDIL) 10 MG 24 hr tablet Take by mouth. 04/04/13   Historical Provider, MD  losartan-hydrochlorothiazide (HYZAAR) 100-25 MG per tablet Take by mouth. 05/21/13   Historical Provider, MD  metoprolol (LOPRESSOR) 100 MG tablet Take 1 tablet (100 mg total) by mouth 2 (two) times daily. 04/27/14   Zannie CovePreetha Joseph, MD  omeprazole (PRILOSEC) 40 MG capsule Take by mouth. 03/28/13  Historical Provider, MD  ondansetron (ZOFRAN ODT) 4 MG disintegrating tablet Take 1 tablet (4 mg total) by mouth every 8 (eight) hours as needed for nausea or vomiting. 06/25/14   Everlene Farrier, PA-C  polyethylene glycol (MIRALAX / GLYCOLAX) packet Take by mouth.  04/30/13   Historical Provider, MD  prednisoLONE 5 MG TABS tablet Take by mouth.    Historical Provider, MD  pregabalin (LYRICA) 75 MG capsule Take by mouth. 04/23/13   Historical Provider, MD   BP 166/73 mmHg  Pulse 81  Temp(Src) 98.4 F (36.9 C)  Resp 18  Ht  (1.626 m)  Wt 212 lb (96.163 kg)  BMI 36.37 kg/m2  SpO2 95% Physical Exam  Constitutional: She is oriented to person, place, and time. She appears well-developed and well-nourished. No distress.  Nontoxic appearing. Obese female.  HENT:  Head: Normocephalic and atraumatic.  Mouth/Throat: Oropharynx is clear and moist.  Eyes: Conjunctivae are normal. Pupils are equal, round, and reactive to light. Right eye exhibits no discharge. Left eye exhibits no discharge.  Neck: Neck supple. No JVD present. No tracheal deviation present.  Cardiovascular: Normal rate, regular rhythm, normal heart sounds and intact distal pulses.   Pulmonary/Chest: Effort normal and breath sounds normal. No respiratory distress. She has no wheezes. She has no rales.  Abdominal: Soft. Bowel sounds are normal. She exhibits no distension and no mass. There is tenderness. There is no rebound and no guarding.  Abdomen is soft. Bowel sounds are present. There is right upper quadrant tenderness with positive Murphy sign. No right lower quadrant tenderness. No chest wall tenderness. No flank tenderness. No CVA tenderness. No peritoneal signs.   Musculoskeletal: She exhibits no edema.  No lower extremity edema or tenderness.  Lymphadenopathy:    She has no cervical adenopathy.  Neurological: She is alert and oriented to person, place, and time. Coordination normal.  Skin: Skin is warm and dry. No rash noted. She is not diaphoretic. No erythema. No pallor.  Psychiatric: She has a normal mood and affect. Her behavior is normal.  Nursing note and vitals reviewed.   ED Course  Procedures (including critical care time) Labs Review Labs Reviewed  URINALYSIS,  ROUTINE W REFLEX MICROSCOPIC (NOT AT [facility]) - Abnormal; Notable for the following:    APPearance CLOUDY (*)    Hgb urine dipstick TRACE (*)    Leukocytes, UA MODERATE (*)    All other components within normal limits  URINE MICROSCOPIC-ADD ON - Abnormal; Notable for the following:    Squamous Epithelial / LPF FEW (*)    Bacteria, UA FEW (*)    All other components within normal limits  COMPREHENSIVE METABOLIC PANEL - Abnormal; Notable for the following:    Potassium 3.4 (*)    Glucose, Bld 130 (*)    Alkaline Phosphatase 131 (*)    All other components within normal limits  CBC WITH DIFFERENTIAL/PLATELET - Abnormal; Notable for the following:    Hemoglobin 11.3 (*)    HCT 34.7 (*)    RDW 15.8 (*)    All other components within normal limits  LIPASE, BLOOD    Imaging Review US Abdomen Complete  06/25/2014   CLINICAL DATA:  Right upper quadrant pain for 1 week. History of cholecystectomy.  EXAM: ULTRASOUND ABDOMEN COMPLETE  COMPARISON:  CT of the chest 04/30/2014  FINDINGS: Gallbladder: Surgically absent.  Common bile duct: Diameter: 10.2 mm  Liver: Note is made of intrahepatic biliary duct dilatation. Hepatic echotexture is normal. No focal liver mass  identified.  IVC: No abnormality visualized.  Pancreas: No focal pancreatic lesion identified. Dilated main pancreatic duct measures 3.7 mm.  Spleen: Size and appearance within normal limits.  Right Kidney: Length: 10.6 cm. Upper pole cyst is 1.6 x 1.4 x 1.5 cm. No hydronephrosis. Normal renal echotexture.  Left Kidney: Length: 11.3 cm. Upper pole cyst is 2.5 x 1.9 x 2.5 cm. A smaller cyst is identified in the midpole region measuring 6 mm. No hydronephrosis. Normal renal echotexture.  Abdominal aorta:  No aneurysm identified.  Atherosclerosis noted.  Other findings: None.  IMPRESSION: 1. Status postcholecystectomy. 2. Mild intrahepatic duct dilatation. Common bile duct is prominent but likely related to the postcholecystectomy state. Correlation  with liver functions test recommended. If there is clinical evidence for obstruction, consider MRI/MRCP. 3. Pancreatic duct is mildly dilated which can be associated with a history of pancreatitis. Rarely, malignancy can present with prominent duct. 4. Bilateral renal cysts.  No hydronephrosis.   Electronically Signed   By: Norva Pavlov M.D.   On: 06/25/2014 20:26     EKG Interpretation   Date/Time:  Thursday Jun 25 2014 17:05:15 EDT Ventricular Rate:  74 PR Interval:  200 QRS Duration: 104 QT Interval:  374 QTC Calculation: 415 R Axis:   52 Text Interpretation:  Unusual P axis, possible ectopic atrial rhythm  Inferior infarct , age undetermined Marked ST abnormality, possible  anterior subendocardial injury Abnormal ECG No change from 04/30/2014  Confirmed by DELO  MD, DOUGLAS ([id number]) on 06/25/2014 5:27:47 PM      Filed Vitals:   06/25/14 1555 06/25/14 1922 06/25/14 2128  BP: 177/78 146/71 166/73  Pulse: 82 76 81  Temp: 98.4 F (36.9 C)    Resp: 18 18 18   Height: 5\' 4"  (1.626 m)    Weight: 212 lb (96.163 kg)    SpO2: 98% 98% 95%     MDM   Meds given in ED:  Medications  ondansetron (ZOFRAN-ODT) disintegrating tablet 4 mg (4 mg Oral Given 06/25/14 1654)  morphine 4 MG/ML injection 4 mg (4 mg Intramuscular Given 06/25/14 1917)  morphine 4 MG/ML injection 4 mg (4 mg Intramuscular Given 06/25/14 2030)    New Prescriptions   ONDANSETRON (ZOFRAN ODT) 4 MG DISINTEGRATING TABLET    Take 1 tablet (4 mg total) by mouth every 8 (eight) hours as needed for nausea or vomiting.    Final diagnoses:  RUQ abdominal pain  Nausea   This is a 64 y.o. female with a history of diabetes, hypertension, appendectomy, hysterectomy, and cholecystectomy who presents to the emergency department complaining of right upper quadrant abdominal pain ongoing for the past week associated with nausea. Patient reports she is been having constant right upper quadrant abdominal pain ongoing for the past  week that can be worse with movement. She complains of 10/10 RUQ pain. She has taken nothing for treatment. She denies any changes to her pain in relation to her eating. She reports nausea but no vomiting. She reports she's been eating and drinking normally. On exam the patient is afebrile and nontoxic appearing. She has mild right upper quadrant tenderness to palpation. Her abdomen is soft and bowel sounds are present. She has no chest tenderness, no CVA tenderness and no flank tenderness. Her EKG is unchanged from her previous tracing. She has a normal lipase. Her CMP shows normal liver enzymes and a mildly elevated alkaline phosphatase at 131. Her CBC shows a hemoglobin that is at her baseline. Her urinalysis shows moderate leukocytes and  is nitrite negative. The patient denies any urinary symptoms and I will therefore not culture this urine. Abdominal ultrasound was obtained which shows she is status post cholecystectomy. There were changes consistent with postcholecystectomy state. She has normal liver enzymes. There is no acute finding on her abdominal ultrasound. At reevaluation the patient reports her pain has improved with morphine. She denies any nausea. We'll discharge the patient and have her follow-up with her primary care provider. Strict return precautions provided. I advised the patient to follow-up with their primary care provider this week. I advised the patient to return to the emergency department with new or worsening symptoms or new concerns. The patient verbalized understanding and agreement with plan.    This patient was discussed with Dr. Judd Lien who agrees with assessment and plan.    Everlene Farrier, PA-C 06/25/14 2152  Geoffery Lyons, MD 06/26/14 1440

## 2014-06-25 NOTE — ED Notes (Signed)
Unable to reassess pain relief. Pt remains in US.

## 2014-06-26 ENCOUNTER — Emergency Department (HOSPITAL_BASED_OUTPATIENT_CLINIC_OR_DEPARTMENT_OTHER)
Admission: EM | Admit: 2014-06-26 | Discharge: 2014-06-26 | Disposition: A | Discharge status: Home / Self Care | Payer: Medicare Other | Attending: Emergency Medicine | Admitting: Emergency Medicine

## 2014-06-26 ENCOUNTER — Encounter (HOSPITAL_BASED_OUTPATIENT_CLINIC_OR_DEPARTMENT_OTHER): Payer: Self-pay

## 2014-06-26 ENCOUNTER — Emergency Department (HOSPITAL_BASED_OUTPATIENT_CLINIC_OR_DEPARTMENT_OTHER): Payer: Medicare Other

## 2014-06-26 DIAGNOSIS — Z79899 Other long term (current) drug therapy: Secondary | ICD-10-CM | POA: Insufficient documentation

## 2014-06-26 DIAGNOSIS — Z7982 Long term (current) use of aspirin: Secondary | ICD-10-CM | POA: Diagnosis not present

## 2014-06-26 DIAGNOSIS — E119 Type 2 diabetes mellitus without complications: Secondary | ICD-10-CM | POA: Insufficient documentation

## 2014-06-26 DIAGNOSIS — R0789 Other chest pain: Secondary | ICD-10-CM | POA: Diagnosis not present

## 2014-06-26 DIAGNOSIS — I471 Supraventricular tachycardia: Secondary | ICD-10-CM | POA: Insufficient documentation

## 2014-06-26 DIAGNOSIS — Z8739 Personal history of other diseases of the musculoskeletal system and connective tissue: Secondary | ICD-10-CM | POA: Diagnosis not present

## 2014-06-26 DIAGNOSIS — R0781 Pleurodynia: Secondary | ICD-10-CM | POA: Diagnosis not present

## 2014-06-26 DIAGNOSIS — R079 Chest pain, unspecified: Secondary | ICD-10-CM

## 2014-06-26 DIAGNOSIS — Z87891 Personal history of nicotine dependence: Secondary | ICD-10-CM | POA: Diagnosis not present

## 2014-06-26 DIAGNOSIS — I10 Essential (primary) hypertension: Secondary | ICD-10-CM | POA: Insufficient documentation

## 2014-06-26 DIAGNOSIS — R791 Abnormal coagulation profile: Secondary | ICD-10-CM | POA: Diagnosis present

## 2014-06-26 HISTORY — DX: Reserved for concepts with insufficient information to code with codable children: IMO0002

## 2014-06-26 HISTORY — DX: Systemic lupus erythematosus, unspecified: M32.9

## 2014-06-26 MED ORDER — ONDANSETRON 4 MG PO TBDP
ORAL_TABLET | ORAL | Status: AC
  Filled 2014-06-26: qty 1

## 2014-06-26 MED ORDER — ONDANSETRON 4 MG PO TBDP
4.0000 mg | ORAL_TABLET | Freq: Once | ORAL | Status: AC
  Administered 2014-06-26: 4 mg via ORAL

## 2014-06-26 MED ORDER — IOHEXOL 350 MG/ML SOLN
100.0000 mL | Freq: Once | INTRAVENOUS | Status: AC | PRN
  Administered 2014-06-26: 100 mL via INTRAVENOUS

## 2014-06-26 NOTE — Discharge Instructions (Signed)

## 2014-06-26 NOTE — ED Notes (Signed)
Pt seen here last night for right side abd pain-seen by PCP today-labs done-called and advised to come to ED-office called charge nurse to notify pt d-dimer elevated

## 2014-06-26 NOTE — ED Provider Notes (Addendum)
CSN: [id number]     Arrival date & time 06/26/14  1901 History  This chart was scribed for Gwyneth Sprout, MD by Phillis Haggis, ED Scribe. This patient was seen in room MH01/MH01 and patient care was started at 7:32 PM.   Chief Complaint  Patient presents with  . Elevated D-dimer    The history is provided by the patient. No language interpreter was used.  HPI Comments: Anna Lester is a 64 y.o. Female with a history of SVT ad SLE who presents to the Emergency Department complaining of an elevated D-Dimer onset one day ago. Patient was seen in the ED last night for right upper abdominal pain and at that time had normal labs and a negative right upper quadrant ultrasound. She followed up with her PCP today and shown that she has an elevated D-Dimer today; she was told to return to the ED. She states that the pain is ongoing; states that she has never experienced pain like this before. She states that the pain is centralized to the right rib area and has been ongoing since last Monday. She reports associated SOB. Patient reports that she had a cardiac ablation 2 weeks ago and believes that this pain may be attributed to that. She denies any injuries or rash to the area. She states that she no longer has a gallbladder and has never had a blood clot. She denies any recent long travel or estrogen therapy. She denies family history of blood clots.   Past Medical History  Diagnosis Date  . SVT (supraventricular tachycardia)   . SLE (systemic lupus erythematosus)   . Essential hypertension   . Diabetes mellitus, type 2   . S/P radiofrequency ablation operation for arrhythmia    Past Surgical History  Procedure Laterality Date  . Appendectomy    . Abdominal hysterectomy    . Hernia repair    . Esophageal dilation     Family History  Problem Relation Age of Onset  . Colon cancer Mother   . Leukemia Sister    History  Substance Use Topics  . Smoking status: Former Smoker    Types:  Cigarettes  . Smokeless tobacco: Not on file  . Alcohol Use: No   OB History    No data available     Review of Systems A complete 10 system review of systems was obtained and all systems are negative except as noted in the HPI and PMH.   Allergies  Ace inhibitors; Amlodipine; Codeine; Isosorbide; Metformin and related; Tramadol; and Nexium  Home Medications   Prior to Admission medications   Medication Sig Start Date End Date Taking? Authorizing Provider  acetaminophen (TYLENOL) 500 MG tablet Take by mouth. 02/13/13   Historical Provider, MD  ammonium lactate (LAC-HYDRIN) 12 % lotion Apply to both feet BID  Indications: Abnormal Dryness of Skin, Eyes or Mucous Membranes 03/04/13   Historical Provider, MD  aspirin 81 MG EC tablet Take by mouth. 11/20/12   Historical Provider, MD  atorvastatin (LIPITOR) 40 MG tablet Take by mouth. 02/13/13   Historical Provider, MD  felodipine (PLENDIL) 10 MG 24 hr tablet Take by mouth. 04/04/13   Historical Provider, MD  losartan-hydrochlorothiazide (HYZAAR) 100-25 MG per tablet Take by mouth. 05/21/13   Historical Provider, MD  metoprolol (LOPRESSOR) 100 MG tablet Take 1 tablet (100 mg total) by mouth 2 (two) times daily. 04/27/14   Zannie Cove, MD  omeprazole (PRILOSEC) 40 MG capsule Take by mouth. 03/28/13   Historical Provider, MD  ondansetron (ZOFRAN ODT) 4 MG disintegrating tablet Take 1 tablet (4 mg total) by mouth every 8 (eight) hours as needed for nausea or vomiting. 06/25/14   Everlene FarrierWilliam Dansie, PA-C  polyethylene glycol (MIRALAX / GLYCOLAX) packet Take by mouth. 04/30/13   Historical Provider, MD  prednisoLONE 5 MG TABS tablet Take by mouth.    Historical Provider, MD  pregabalin (LYRICA) 75 MG capsule Take by mouth. 04/23/13   Historical Provider, MD   BP 183/77 mmHg  Pulse 83  Temp(Src) 98.7 F (37.1 C) (Oral)  Resp 18  Ht 5\' 4"  (1.626 m)  Wt 214 lb (97.07 kg)  BMI 36.72 kg/m2  SpO2 98%   Physical Exam  Constitutional: She is oriented to  person, place, and time. She appears well-developed and well-nourished.  HENT:  Head: Normocephalic and atraumatic.  Eyes: EOM are normal. Pupils are equal, round, and reactive to light.  Neck: Normal range of motion. Neck supple.  Cardiovascular: Normal rate, regular rhythm and intact distal pulses.  Exam reveals friction rub.   No murmur heard. Pulmonary/Chest: Effort normal. No respiratory distress. She has no wheezes. She has no rales. She exhibits tenderness. She exhibits no crepitus and no retraction.  Abdominal:  Right lower rib tenderness to palpation; no abdominal tenderness  Musculoskeletal: Normal range of motion.  Neurological: She is alert and oriented to person, place, and time.  Skin: Skin is warm and dry. No rash noted.  Psychiatric: She has a normal mood and affect. Her behavior is normal.  Nursing note and vitals reviewed.   ED Course  Procedures (including critical care time) DIAGNOSTIC STUDIES: Oxygen Saturation is 98% on room air, normal by my interpretation.    COORDINATION OF CARE: 7:37 PM-Discussed treatment plan which includes CT scan with pt at bedside and pt agreed to plan.   Labs Review Labs Reviewed - No data to display  Imaging Review Ct Angio Chest Pe W/cm &/or Wo Cm  06/26/2014   CLINICAL DATA:  Right sided chest pain, laterally under right breast, SOBDiabetic, hx lupus, no hx asthma or copd  EXAM: CT ANGIOGRAPHY CHEST WITH CONTRAST  TECHNIQUE: Multidetector CT imaging of the chest was performed using the standard protocol during bolus administration of intravenous contrast. Multiplanar CT image reconstructions and MIPs were obtained to evaluate the vascular anatomy.  CONTRAST:  100mL OMNIPAQUE IOHEXOL 350 MG/ML SOLN  COMPARISON:  Current chest radiograph.  Chest CT, 04/30/2014.  FINDINGS: Angiographic study: No evidence of a pulmonary embolus. Thoracic aorta is normal in caliber for age. There is mild atherosclerotic calcifications along the arch and  ascending portion. No dissection.  Thoracic inlet: Mild enlargement of the thyroid gland without change from the prior CT. Subtle small nodules. No neck base mass or adenopathy.  Mediastinum and hila: Heart normal in size. Mild coronary artery calcifications. No mediastinal or hilar masses or pathologically enlarged lymph nodes.  Lungs and pleura: No lung consolidation or edema. Mild subsegmental atelectasis noted in the lower lobes. No lung mass or suspicious nodule. No pleural effusion or pneumothorax.  Limited upper abdomen: Status post cholecystectomy. Surgical clips along the greater curvature of the stomach. Otherwise unremarkable.  Musculoskeletal: Mild disc degenerative changes along the thoracic spine. No osteoblastic or osteolytic lesions.  Review of the MIP images confirms the above findings.  IMPRESSION: 1. No evidence of a pulmonary embolus. 2. No acute finding.   Electronically Signed   By: Amie Portlandavid  Ormond M.D.   On: 06/26/2014 20:27   [place] Abdomen Complete  06/25/2014  CLINICAL DATA:  Right upper quadrant pain for 1 week. History of cholecystectomy.  EXAM: ULTRASOUND ABDOMEN COMPLETE  COMPARISON:  CT of the chest 04/30/2014  FINDINGS: Gallbladder: Surgically absent.  Common bile duct: Diameter: 10.2 mm  Liver: Note is made of intrahepatic biliary duct dilatation. Hepatic echotexture is normal. No focal liver mass identified.  IVC: No abnormality visualized.  Pancreas: No focal pancreatic lesion identified. Dilated main pancreatic duct measures 3.7 mm.  Spleen: Size and appearance within normal limits.  Right Kidney: Length: 10.6 cm. Upper pole cyst is 1.6 x 1.4 x 1.5 cm. No hydronephrosis. Normal renal echotexture.  Left Kidney: Length: 11.3 cm. Upper pole cyst is 2.5 x 1.9 x 2.5 cm. A smaller cyst is identified in the midpole region measuring 6 mm. No hydronephrosis. Normal renal echotexture.  Abdominal aorta:  No aneurysm identified.  Atherosclerosis noted.  Other findings: None.  IMPRESSION: 1.  Status postcholecystectomy. 2. Mild intrahepatic duct dilatation. Common bile duct is prominent but likely related to the postcholecystectomy state. Correlation with liver functions test recommended. If there is clinical evidence for obstruction, consider MRI/MRCP. 3. Pancreatic duct is mildly dilated which can be associated with a history of pancreatitis. Rarely, malignancy can present with prominent duct. 4. Bilateral renal cysts.  No hydronephrosis.   Electronically Signed   By: Norva Pavlov M.D.   On: 06/25/2014 20:26     EKG Interpretation None      MDM   Final diagnoses:  Chest pain  Chest wall pain    Patient returning to the emergency room today after being called by her PCP for an elevated d-dimer. She does have a history of lupus as well as a recent procedure 2 weeks ago from cardiac ablation. Patient does not have history of prior PE. However on exam today she gave a slightly different history than yesterday with rib pain and shortness of breath with nausea.  There is no evidence of rash consistent with shingles and on exam has no right upper quadrant pain but all right lower rib pain. Labs from yesterday showed a normal creatinine and d-dimer from the office with 0.91. CT angiogram of the chest pending.  8:40 PM CTA neg.  Pt could have pleursy vs msk pain.  Findings discussed with pt and her family.  D/ced home to f/u with PCP.   I personally performed the services described in this documentation, which was scribed in my presence.  The recorded information has been reviewed and considered.    Gwyneth Sprout, MD 06/26/14 7829  Gwyneth Sprout, MD 06/26/14 5621  Gwyneth Sprout, MD 06/26/14 2043

## 2014-06-26 NOTE — ED Notes (Signed)
Patient transported to CT 

## 2014-10-01 ENCOUNTER — Encounter (HOSPITAL_BASED_OUTPATIENT_CLINIC_OR_DEPARTMENT_OTHER): Payer: Self-pay | Admitting: *Deleted

## 2014-10-01 ENCOUNTER — Emergency Department (HOSPITAL_BASED_OUTPATIENT_CLINIC_OR_DEPARTMENT_OTHER)
Admission: EM | Admit: 2014-10-01 | Discharge: 2014-10-01 | Disposition: A | Discharge status: Home / Self Care | Payer: Medicare Other | Attending: Emergency Medicine | Admitting: Emergency Medicine

## 2014-10-01 ENCOUNTER — Emergency Department (HOSPITAL_BASED_OUTPATIENT_CLINIC_OR_DEPARTMENT_OTHER): Payer: Medicare Other

## 2014-10-01 DIAGNOSIS — S8992XA Unspecified injury of left lower leg, initial encounter: Secondary | ICD-10-CM | POA: Diagnosis present

## 2014-10-01 DIAGNOSIS — Y9301 Activity, walking, marching and hiking: Secondary | ICD-10-CM | POA: Insufficient documentation

## 2014-10-01 DIAGNOSIS — E119 Type 2 diabetes mellitus without complications: Secondary | ICD-10-CM | POA: Diagnosis not present

## 2014-10-01 DIAGNOSIS — Y9289 Other specified places as the place of occurrence of the external cause: Secondary | ICD-10-CM | POA: Diagnosis not present

## 2014-10-01 DIAGNOSIS — Z7982 Long term (current) use of aspirin: Secondary | ICD-10-CM | POA: Insufficient documentation

## 2014-10-01 DIAGNOSIS — Y998 Other external cause status: Secondary | ICD-10-CM | POA: Diagnosis not present

## 2014-10-01 DIAGNOSIS — I10 Essential (primary) hypertension: Secondary | ICD-10-CM | POA: Diagnosis not present

## 2014-10-01 DIAGNOSIS — S82832A Other fracture of upper and lower end of left fibula, initial encounter for closed fracture: Secondary | ICD-10-CM | POA: Diagnosis not present

## 2014-10-01 DIAGNOSIS — Z87891 Personal history of nicotine dependence: Secondary | ICD-10-CM | POA: Insufficient documentation

## 2014-10-01 DIAGNOSIS — Z8639 Personal history of other endocrine, nutritional and metabolic disease: Secondary | ICD-10-CM | POA: Insufficient documentation

## 2014-10-01 DIAGNOSIS — Z8739 Personal history of other diseases of the musculoskeletal system and connective tissue: Secondary | ICD-10-CM | POA: Insufficient documentation

## 2014-10-01 DIAGNOSIS — W19XXXA Unspecified fall, initial encounter: Secondary | ICD-10-CM

## 2014-10-01 DIAGNOSIS — W108XXA Fall (on) (from) other stairs and steps, initial encounter: Secondary | ICD-10-CM | POA: Insufficient documentation

## 2014-10-01 DIAGNOSIS — S82402A Unspecified fracture of shaft of left fibula, initial encounter for closed fracture: Secondary | ICD-10-CM

## 2014-10-01 HISTORY — DX: Disorder of thyroid, unspecified: E07.9

## 2014-10-01 HISTORY — DX: Palpitations: R00.2

## 2014-10-01 HISTORY — DX: Chest pain, unspecified: R07.9

## 2014-10-01 MED ORDER — HYDROCODONE-ACETAMINOPHEN 5-325 MG PO TABS: 1.0000 | ORAL_TABLET | ORAL | Status: DC | PRN

## 2014-10-01 MED ORDER — HYDROCODONE-ACETAMINOPHEN 5-325 MG PO TABS
2.0000 | ORAL_TABLET | Freq: Once | ORAL | Status: DC
  Filled 2014-10-01: qty 2

## 2014-10-01 NOTE — ED Notes (Signed)
Patient states she was stepping down on some steps that had a crack in the step.  States her foot went into the crack , causing her left leg to twist and she fell.  C/o pain in the entire left leg and hip area.  Denies loc.

## 2014-10-01 NOTE — ED Notes (Signed)
Pt refused pain medication at this time.  States she would like to wait until xray results are back and does not want to take pain medication before she eats a meal if she "doesn't need to."  Advised patient that pain medication administration was on stand by should she change her mind about taking it sooner.

## 2014-10-01 NOTE — ED Notes (Signed)
Pt continues to refuse pain medication at this time.  Family sitting with patient at bedside.  Awaiting radiology study results.

## 2014-10-01 NOTE — ED Provider Notes (Signed)
CSN: [id number]     Arrival date & time 10/01/14  1639 History   First MD Initiated Contact with Patient 10/01/14 1657     Chief Complaint  Patient presents with  . Fall     (Consider location/radiation/quality/duration/timing/severity/associated sxs/prior Treatment) HPI Comments: Patient complains of pain in her entire left leg after she was walking down a concrete step with her foot went into a crack and she fell onto her left side onto her leg. Denies hitting her head or losing consciousness. She does not take any blood thinners. Complains of pain to left hip, knee, lower leg and ankle. No head, neck or back pain. No chest pain or shortness of breath. No abdominal pain. She was able to drive herself home from her nephew's house but then she could not get out of the car so EMS was called.  The history is provided by the patient.    Past Medical History  Diagnosis Date  . SVT (supraventricular tachycardia)   . SLE (systemic lupus erythematosus)   . Essential hypertension   . Diabetes mellitus, type 2   . S/P radiofrequency ablation operation for arrhythmia   . Lupus   . Palpitations   . Chest pain   . Thyroid disease     enlarged thyroid, followed by PCP   Past Surgical History  Procedure Laterality Date  . Appendectomy    . Abdominal hysterectomy    . Hernia repair    . Esophageal dilation     Family History  Problem Relation Age of Onset  . Colon cancer Mother   . Leukemia Sister    Social History  Substance Use Topics  . Smoking status: Former Smoker    Types: Cigarettes  . Smokeless tobacco: None  . Alcohol Use: No   OB History    No data available     Review of Systems  Constitutional: Negative for fever, activity change and appetite change.  HENT: Negative for congestion and rhinorrhea.   Respiratory: Negative for cough, chest tightness and shortness of breath.   Cardiovascular: Negative for chest pain.  Gastrointestinal: Negative for nausea, vomiting and  abdominal pain.  Genitourinary: Negative for dysuria, hematuria, vaginal bleeding and vaginal discharge.  Musculoskeletal: Positive for myalgias and arthralgias. Negative for back pain.  Skin: Negative for wound.  Neurological: Negative for dizziness, weakness and headaches.  A complete 10 system review of systems was obtained and all systems are negative except as noted in the HPI and PMH.      Allergies  Ace inhibitors; Amlodipine; Codeine; Isosorbide; Metformin and related; Tramadol; and Nexium  Home Medications   Prior to Admission medications   Medication Sig Start Date End Date Taking? Authorizing Provider  ammonium lactate (LAC-HYDRIN) 12 % lotion Apply to both feet BID  Indications: Abnormal Dryness of Skin, Eyes or Mucous Membranes 03/04/13  Yes Historical Provider, MD  aspirin 81 MG EC tablet Take by mouth. 11/20/12  Yes Historical Provider, MD  atorvastatin (LIPITOR) 40 MG tablet Take by mouth. 02/13/13  Yes Historical Provider, MD  losartan-hydrochlorothiazide (HYZAAR) 100-25 MG per tablet Take by mouth. 05/21/13  Yes Historical Provider, MD  metoprolol (LOPRESSOR) 100 MG tablet Take 1 tablet (100 mg total) by mouth 2 (two) times daily. 04/27/14  Yes Zannie Cove, MD  omeprazole (PRILOSEC) 40 MG capsule Take by mouth. 03/28/13  Yes Historical Provider, MD  ondansetron (ZOFRAN ODT) 4 MG disintegrating tablet Take 1 tablet (4 mg total) by mouth every 8 (eight) hours as needed  for nausea or vomiting. 06/25/14  Yes Everlene Farrier, PA-C  prednisoLONE 5 MG TABS tablet Take 10 mg by mouth.    Yes Historical Provider, MD  acetaminophen (TYLENOL) 500 MG tablet Take by mouth. 02/13/13   Historical Provider, MD  felodipine (PLENDIL) 10 MG 24 hr tablet Take by mouth. 04/04/13   Historical Provider, MD  HYDROcodone-acetaminophen (NORCO/VICODIN) 5-325 MG per tablet Take 1 tablet by mouth every 4 (four) hours as needed. 10/01/14   Glynn Octave, MD  polyethylene glycol [facility]) packet  Take by mouth. 04/30/13   Historical Provider, MD  pregabalin (LYRICA) 75 MG capsule Take by mouth. 04/23/13   Historical Provider, MD   BP 179/89 mmHg  Pulse 82  Temp(Src) 98.4 F (36.9 C) (Oral)  Resp 18  Ht  (1.626 m)  Wt 210 lb (95.255 kg)  BMI 36.03 kg/m2  SpO2 98% Physical Exam  Constitutional: She is oriented to person, place, and time. She appears well-developed and well-nourished. No distress.  HENT:  Head: Normocephalic and atraumatic.  Mouth/Throat: Oropharynx is clear and moist. No oropharyngeal exudate.  Eyes: Conjunctivae and EOM are normal. Pupils are equal, round, and reactive to light.  Neck: Normal range of motion. Neck supple.  No C spine pain  Cardiovascular: Normal rate, regular rhythm, normal heart sounds and intact distal pulses.   No murmur heard. Pulmonary/Chest: Effort normal and breath sounds normal. No respiratory distress.  Abdominal: Soft. There is no tenderness. There is no rebound and no guarding.  Musculoskeletal: She exhibits tenderness. She exhibits no edema.  No T or L spine pain Diffuse tenderness to palpation of the entire left leg. There is no deformity. Patient is hesitant to range her hip, ankle or knee. Intact DP and PT pulses. No shortening or external rotation  Neurological: She is alert and oriented to person, place, and time. No cranial nerve deficit. She exhibits normal muscle tone. Coordination normal.  No ataxia on finger to nose bilaterally. No pronator drift. 5/5 strength throughout. CN 2-12 intact.  Equal grip strength. Sensation intact.   Skin: Skin is warm.  Psychiatric: She has a normal mood and affect. Her behavior is normal.  Nursing note and vitals reviewed.   ED Course  Procedures (including critical care time) Labs Review Labs Reviewed - No data to display  Imaging Review Dg Pelvis 1-2 Views  10/01/2014   CLINICAL DATA:  Status post fall today with a left hip injury and pain. Initial encounter.  EXAM: PELVIS - 1-2  VIEW  COMPARISON:  None.  FINDINGS: There is no evidence of pelvic fracture or diastasis. No pelvic bone lesions are seen.  IMPRESSION: Negative exam.   Electronically Signed   By: Drusilla Kanner M.D.   On: 10/01/2014 18:23   Dg Tibia/fibula Left  10/01/2014   ADDENDUM REPORT: 10/01/2014 18:31  ADDENDUM: Transverse cortical discontinuity at the head of the fibula is noted which could represent fracture given the history of trauma but is suboptimally evaluated. Correlate for point tenderness to this area.  Addendum by Dr. Christiana Pellant on 10/01/2014 at 6:30 p.m.   Electronically Signed   By: Christiana Pellant M.D.   On: 10/01/2014 18:31   10/01/2014   CLINICAL DATA:  Fall, left lower extremity pain  EXAM: LEFT TIBIA AND FIBULA - 2 VIEW  COMPARISON:  None.  FINDINGS: There is no evidence of fracture or other focal bone lesions. Soft tissues are unremarkable. Mild tricompartmental degenerative change in the knee noted, partly visualized.  IMPRESSION: Negative.  Electronically Signed: By: Christiana Pellant M.D. On: 10/01/2014 18:26   Dg Ankle Complete Left  10/01/2014   CLINICAL DATA:  Patient status post fall with left ankle pain and injury. Patient unable to bear weight.  EXAM: LEFT ANKLE COMPLETE - 3+ VIEW  COMPARISON:  None.  FINDINGS: Normal anatomic alignment. No evidence for acute fracture or dislocation. Talar dome is intact. Vascular calcifications.  IMPRESSION: No acute osseous abnormality.   Electronically Signed   By: Annia Belt M.D.   On: 10/01/2014 18:38   Dg Knee Complete 4 Views Left  10/01/2014   CLINICAL DATA:  Status post fall today with a left knee injury and pain. Initial encounter.  EXAM: LEFT KNEE - COMPLETE 4+ VIEW  COMPARISON:  None.  FINDINGS: The patient has an acute mildly distracted fracture of the left fibular head. No other fracture is identified. There is no joint effusion. Osteoarthritis is seen about the medial compartment where near bone-on-bone joint space narrowing is seen.   IMPRESSION: Acute mildly distracted fracture of the left fibular head.  Advanced medial compartment osteoarthritis.   Electronically Signed   By: Drusilla Kanner M.D.   On: 10/01/2014 18:26   Dg Femur Min 2 Views Left  10/01/2014   CLINICAL DATA:  Fall, left knee pain  EXAM: LEFT FEMUR 2 VIEWS  COMPARISON:  None.  FINDINGS: There is transverse cortical discontinuity at the head of the fibula. No radiopaque foreign body. The femur is unremarkable. Mild tricompartmental degenerative change noted.  IMPRESSION: Transverse cortical discontinuity at the head of the fibula which could represent fracture given the history of trauma but is suboptimally evaluated. Correlate for point tenderness to this area.   Electronically Signed   By: Christiana Pellant M.D.   On: 10/01/2014 18:29   I have personally reviewed and evaluated these images and lab results as part of my medical decision-making.   EKG Interpretation None      MDM   Final diagnoses:  Closed fibular fracture, left, initial encounter   Left leg pain after mechanical fall. Patient with difficulty pinpointing area of pain. She is neuro vascular intact. Hesitant to range any of the joints of her left leg.  X-rays are negative other than febrile head fracture. Patient refuses pain medication.  Discussed with on-call orthopedics Dr. Linna Caprice as ankle is not involved he feels patient does not need to be immobilized and can weight-bear as tolerated.  Patient is able to bear weight. She continues to refuse pain medication. Instructed to follow-up with orthopedics.  Glynn Octave, MD 10/02/14 0030

## 2014-10-01 NOTE — Discharge Instructions (Signed)
Fibular Fracture, Ankle, Adult, Treated With or Without Immobilization You may bear weight as tolerated. Follow-up with the bone doctor this week. Return to the ED if he develop new or worsening symptoms. A fibular fracture at your ankle is a break (fracture) bone in the smallest of the two bones in your lower leg, located on the outside of your leg (fibula) close to the area at your ankle joint. CAUSES  Rolling your ankle.  Twisting your ankle.  Extreme flexing or extending of your foot.  Severe force on your ankle as when falling from a distance. RISK FACTORS  Jumping activities.  Participation in sports.  Osteoporosis.  Advanced age.  Previous ankle injuries. SIGNS AND SYMPTOMS  Pain.  Swelling.  Inability to put weight on injured ankle.  Bruising.  Bone deformities at site of injury. DIAGNOSIS  This fracture is diagnosed with the help of an X-ray exam. TREATMENT  If the fractured bone did not move out of place it usually will heal without problems and does casting or splinting. If immobilization is needed for comfort or the fractured bone moved out of place and will not heal properly with immobilization, a cast or splint will be used. HOME CARE INSTRUCTIONS   Apply ice to the area of injury:  Put ice in a plastic bag.  Place a towel between your skin and the bag.  Leave the ice on for 20 minutes, 2-3 times a day.  Use crutches as directed. Resume walking without crutches as directed by your health care provider.  Only take over-the-counter or prescription medicines for pain, discomfort, or fever as directed by your health care provider.  If you have a removable splint or boot, do not remove the boot unless directed by your health care provider. SEEK MEDICAL CARE IF:   You have continued pain or more swelling  The medications do not control the pain. SEEK IMMEDIATE MEDICAL CARE IF:  You develop severe pain in the leg or foot.  Your skin or nails below  the injury turn blue or grey or feel cold or numb. MAKE SURE YOU:   Understand these instructions.  Will watch your condition.  Will get help right away if you are not doing well or get worse. Document Released: 01/16/2005 Document Revised: 11/06/2012 Document Reviewed: 08/28/2012 [facility] Patient Information 2015 [place], [place]. This information is not intended to replace advice given to you by your health care provider. Make sure you discuss any questions you have with your health care provider.

## 2014-10-01 NOTE — ED Notes (Addendum)
EMS reports, the Patient was stepping down from the curb and leg twisted and fell.  No loc. C/O left leg.  VSS.  146/ 76, 96% O2sat, 16 RR, 77 hr, CBG 115.

## 2014-10-11 ENCOUNTER — Encounter (HOSPITAL_COMMUNITY): Payer: Self-pay | Admitting: *Deleted

## 2014-10-11 ENCOUNTER — Emergency Department (HOSPITAL_COMMUNITY)
Admission: EM | Admit: 2014-10-11 | Discharge: 2014-10-11 | Disposition: A | Discharge status: Home / Self Care | Payer: Medicare Other | Attending: Emergency Medicine | Admitting: Emergency Medicine

## 2014-10-11 ENCOUNTER — Emergency Department (HOSPITAL_COMMUNITY): Payer: Medicare Other

## 2014-10-11 DIAGNOSIS — I10 Essential (primary) hypertension: Secondary | ICD-10-CM | POA: Insufficient documentation

## 2014-10-11 DIAGNOSIS — Z79899 Other long term (current) drug therapy: Secondary | ICD-10-CM | POA: Insufficient documentation

## 2014-10-11 DIAGNOSIS — M545 Low back pain, unspecified: Secondary | ICD-10-CM

## 2014-10-11 DIAGNOSIS — Z7982 Long term (current) use of aspirin: Secondary | ICD-10-CM | POA: Insufficient documentation

## 2014-10-11 DIAGNOSIS — R1032 Left lower quadrant pain: Secondary | ICD-10-CM

## 2014-10-11 DIAGNOSIS — Z8679 Personal history of other diseases of the circulatory system: Secondary | ICD-10-CM | POA: Insufficient documentation

## 2014-10-11 DIAGNOSIS — E119 Type 2 diabetes mellitus without complications: Secondary | ICD-10-CM | POA: Insufficient documentation

## 2014-10-11 DIAGNOSIS — Z87891 Personal history of nicotine dependence: Secondary | ICD-10-CM | POA: Diagnosis not present

## 2014-10-11 LAB — COMPREHENSIVE METABOLIC PANEL
ALBUMIN: 4.1 g/dL (ref 3.5–5.0)
ALT: 20 U/L (ref 14–54)
ANION GAP: 11 (ref 5–15)
AST: 29 U/L (ref 15–41)
Alkaline Phosphatase: 116 U/L (ref 38–126)
BUN: 24 mg/dL — ABNORMAL HIGH (ref 6–20)
CO2: 27 mmol/L (ref 22–32)
Calcium: 9.8 mg/dL (ref 8.9–10.3)
Chloride: 103 mmol/L (ref 101–111)
Creatinine, Ser: 1.09 mg/dL — ABNORMAL HIGH (ref 0.44–1.00)
GFR calc Af Amer: 60 mL/min — ABNORMAL LOW (ref >60)
GFR calc non Af Amer: 52 mL/min — ABNORMAL LOW (ref >60)
GLUCOSE: 151 mg/dL — AB (ref 65–99)
POTASSIUM: 3.9 mmol/L (ref 3.5–5.1)
Sodium: 141 mmol/L (ref 135–145)
Total Bilirubin: 0.5 mg/dL (ref 0.3–1.2)
Total Protein: 7.6 g/dL (ref 6.5–8.1)

## 2014-10-11 LAB — URINALYSIS, ROUTINE W REFLEX MICROSCOPIC
Bilirubin Urine: NEGATIVE
Glucose, UA: NEGATIVE mg/dL
KETONES UR: NEGATIVE mg/dL
LEUKOCYTES UA: NEGATIVE
Nitrite: NEGATIVE
PROTEIN: NEGATIVE mg/dL
Specific Gravity, Urine: 1.025 (ref 1.005–1.030)
Urobilinogen, UA: 0.2 mg/dL (ref 0.0–1.0)
pH: 6 (ref 5.0–8.0)

## 2014-10-11 LAB — CBC WITH DIFFERENTIAL/PLATELET
BASOS ABS: 0 10*3/uL (ref 0.0–0.1)
Basophils Relative: 0 % (ref 0–1)
EOS PCT: 0 % (ref 0–5)
Eosinophils Absolute: 0 10*3/uL (ref 0.0–0.7)
HEMATOCRIT: 38.4 % (ref 36.0–46.0)
Hemoglobin: 12.6 g/dL (ref 12.0–15.0)
Lymphocytes Relative: 18 % (ref 12–46)
Lymphs Abs: 1.7 10*3/uL (ref 0.7–4.0)
MCH: 27.2 pg (ref 26.0–34.0)
MCHC: 32.8 g/dL (ref 30.0–36.0)
MCV: 82.9 fL (ref 78.0–100.0)
Monocytes Absolute: 0.3 10*3/uL (ref 0.1–1.0)
Monocytes Relative: 3 % (ref 3–12)
NEUTROS ABS: 7.4 10*3/uL (ref 1.7–7.7)
Neutrophils Relative %: 79 % — ABNORMAL HIGH (ref 43–77)
PLATELETS: 224 10*3/uL (ref 150–400)
RBC: 4.63 MIL/uL (ref 3.87–5.11)
RDW: 17.9 % — AB (ref 11.5–15.5)
WBC: 9.5 10*3/uL (ref 4.0–10.5)

## 2014-10-11 LAB — URINE MICROSCOPIC-ADD ON

## 2014-10-11 LAB — LIPASE, BLOOD: Lipase: 28 U/L (ref 22–51)

## 2014-10-11 MED ORDER — ONDANSETRON HCL 4 MG/2ML IJ SOLN
4.0000 mg | Freq: Once | INTRAMUSCULAR | Status: AC
  Administered 2014-10-11: 4 mg via INTRAVENOUS
  Filled 2014-10-11: qty 2

## 2014-10-11 MED ORDER — SODIUM CHLORIDE 0.9 % IV SOLN: 80.0000 mg | Freq: Once | INTRAVENOUS | Status: DC

## 2014-10-11 MED ORDER — HYDROCODONE-ACETAMINOPHEN 5-325 MG PO TABS: 1.0000 | ORAL_TABLET | Freq: Four times a day (QID) | ORAL | Status: AC | PRN

## 2014-10-11 MED ORDER — IOHEXOL 300 MG/ML  SOLN
50.0000 mL | Freq: Once | INTRAMUSCULAR | Status: DC | PRN
  Administered 2014-10-11: 50 mL via ORAL
  Filled 2014-10-11: qty 50

## 2014-10-11 MED ORDER — SODIUM CHLORIDE 0.9 % IV SOLN: 10.0000 mL/h | Freq: Once | INTRAVENOUS | Status: DC

## 2014-10-11 MED ORDER — SODIUM CHLORIDE 0.9 % IV BOLUS (SEPSIS): 500.0000 mL | Freq: Once | INTRAVENOUS | Status: DC

## 2014-10-11 MED ORDER — MORPHINE SULFATE (PF) 4 MG/ML IV SOLN
4.0000 mg | Freq: Once | INTRAVENOUS | Status: AC
  Administered 2014-10-11: 4 mg via INTRAVENOUS
  Filled 2014-10-11: qty 1

## 2014-10-11 MED ORDER — IOHEXOL 300 MG/ML  SOLN
100.0000 mL | Freq: Once | INTRAMUSCULAR | Status: AC | PRN
  Administered 2014-10-11: 100 mL via INTRAVENOUS

## 2014-10-11 NOTE — Discharge Instructions (Signed)
Return without fail for worsening symptoms, including vomiting unable to keep down food or fluids, fevers, worsening pain, or any other symptoms concerning to you.  Abdominal Pain Many things can cause abdominal pain. Usually, abdominal pain is not caused by a disease and will improve without treatment. It can often be observed and treated at home. Your health care provider will do a physical exam and possibly order blood tests and X-rays to help determine the seriousness of your pain. However, in many cases, more time must pass before a clear cause of the pain can be found. Before that point, your health care provider may not know if you need more testing or further treatment. HOME CARE INSTRUCTIONS  Monitor your abdominal pain for any changes. The following actions may help to alleviate any discomfort you are experiencing:  Only take over-the-counter or prescription medicines as directed by your health care provider.  Do not take laxatives unless directed to do so by your health care provider.  Try a clear liquid diet (broth, tea, or water) as directed by your health care provider. Slowly move to a bland diet as tolerated. SEEK MEDICAL CARE IF:  You have unexplained abdominal pain.  You have abdominal pain associated with nausea or diarrhea.  You have pain when you urinate or have a bowel movement.  You experience abdominal pain that wakes you in the night.  You have abdominal pain that is worsened or improved by eating food.  You have abdominal pain that is worsened with eating fatty foods.  You have a fever. SEEK IMMEDIATE MEDICAL CARE IF:   Your pain does not go away within 2 hours.  You keep throwing up (vomiting).  Your pain is felt only in portions of the abdomen, such as the right side or the left lower portion of the abdomen.  You pass bloody or black tarry stools. MAKE SURE YOU:  Understand these instructions.   Will watch your condition.   Will get help right  away if you are not doing well or get worse.  Document Released: 10/26/2004 Document Revised: 01/21/2013 Document Reviewed: 09/25/2012 [facility] Patient Information 2015 [place], [place]. This information is not intended to replace advice given to you by your health care provider. Make sure you discuss any questions you have with your health care provider.  Back Pain, Adult Back pain is very common. The pain often gets better over time. The cause of back pain is usually not dangerous. Most people can learn to manage their back pain on their own.  HOME CARE   Stay active. Start with short walks on flat ground if you can. Try to walk farther each day.  Do not sit, drive, or stand in one place for more than 30 minutes. Do not stay in bed.  Do not avoid exercise or work. Activity can help your back heal faster.  Be careful when you bend or lift an object. Bend at your knees, keep the object close to you, and do not twist.  Sleep on a firm mattress. Lie on your side, and bend your knees. If you lie on your back, put a pillow under your knees.  Only take medicines as told by your doctor.  Put ice on the injured area.  Put ice in a plastic bag.  Place a towel between your skin and the bag.  Leave the ice on for 15-20 minutes, 03-04 times a day for the first 2 to 3 days. After that, you can switch between ice and  heat packs.  Ask your doctor about back exercises or massage.  Avoid feeling anxious or stressed. Find good ways to deal with stress, such as exercise. GET HELP RIGHT AWAY IF:   Your pain does not go away with rest or medicine.  Your pain does not go away in 1 week.  You have new problems.  You do not feel well.  The pain spreads into your legs.  You cannot control when you poop (bowel movement) or pee (urinate).  Your arms or legs feel weak or lose feeling (numbness).  You feel sick to your stomach (nauseous) or throw up (vomit).  You have belly (abdominal)  pain.  You feel like you may pass out (faint). MAKE SURE YOU:   Understand these instructions.  Will watch your condition.  Will get help right away if you are not doing well or get worse. Document Released: 07/05/2007 Document Revised: 04/10/2011 Document Reviewed: 05/20/2013 [facility] Patient Information 2015 [place], [place]. This information is not intended to replace advice given to you by your health care provider. Make sure you discuss any questions you have with your health care provider.

## 2014-10-11 NOTE — ED Notes (Addendum)
Patient comes from home with a complaint of back and lower abdominal pain that started around 0600 today. Patient complains that is gets worse with movement. Patient complained of nausea, but no emesis or diarrhea. Patient also denies injury. Patient has history of chronic back pain but today the pain is in her back and lower abdomen simultaneously.

## 2014-10-11 NOTE — ED Provider Notes (Addendum)
CSN: [id number]     Arrival date & time 10/11/14  0900 History   First MD Initiated Contact with Patient 10/11/14 [phone]     Chief Complaint  Patient presents with  . Abdominal Pain  . Back Pain     (Consider location/radiation/quality/duration/timing/severity/associated sxs/prior Treatment) HPI 64 year old female who presents with abdominal pain and low back pain. History of SVT status post ablation, diabetes, lupus. Also history of appendectomy, hernia repair, and abdominal hysterectomy. Symptoms began this morning after waking up from bed. Has had back pain like this before, but no abdominal pain like this before. This is associated with nausea, but denies vomiting. Had diarrhea yesterday, but reports alternating diarrhea and constipation is normal for her. Denies dysuria, but notes urinary frequency. Denies hematuria. Denies fever or chills. Denies sob, cough, or other URI symptoms, and denies chest pain.     Past Medical History  Diagnosis Date  . SVT (supraventricular tachycardia)   . SLE (systemic lupus erythematosus)   . Essential hypertension   . Diabetes mellitus, type 2   . S/P radiofrequency ablation operation for arrhythmia   . Lupus   . Palpitations   . Chest pain   . Thyroid disease     enlarged thyroid, followed by PCP   Past Surgical History  Procedure Laterality Date  . Appendectomy    . Abdominal hysterectomy    . Hernia repair    . Esophageal dilation     Family History  Problem Relation Age of Onset  . Colon cancer Mother   . Leukemia Sister    Social History  Substance Use Topics  . Smoking status: Former Smoker    Types: Cigarettes  . Smokeless tobacco: None  . Alcohol Use: No   OB History    No data available     Review of Systems  10/14 systems reviewed and are negative other than those stated in the HPI   Allergies  Ace inhibitors; Amlodipine; Codeine; Isosorbide; Metformin and related; Tramadol; and Nexium  Home Medications   Prior  to Admission medications   Medication Sig Start Date End Date Taking? Authorizing Provider  ammonium lactate (LAC-HYDRIN) 12 % lotion Apply 1 application topically 2 (two) times daily. Apply to both feet Indications: Abnormal Dryness of Skin, Eyes or Mucous Membranes 03/04/13  Yes Historical Provider, MD  aspirin 81 MG EC tablet Take 81 mg by mouth daily.  11/20/12  Yes Historical Provider, MD  atorvastatin (LIPITOR) 40 MG tablet Take 40 mg by mouth daily at 6 PM.  02/13/13  Yes Historical Provider, MD  HYDROcodone-acetaminophen (NORCO/VICODIN) 5-325 MG per tablet Take 1 tablet by mouth every 4 (four) hours as needed. Patient taking differently: Take 1 tablet by mouth every 4 (four) hours as needed for severe pain.  10/01/14  Yes Glynn Octave, MD  losartan-hydrochlorothiazide (HYZAAR) 100-25 MG per tablet Take 1 tablet by mouth daily.  05/21/13  Yes Historical Provider, MD  metoprolol (LOPRESSOR) 100 MG tablet Take 1 tablet (100 mg total) by mouth 2 (two) times daily. 04/27/14  Yes Zannie Cove, MD  omeprazole (PRILOSEC) 40 MG capsule Take 40 mg by mouth daily.  03/28/13  Yes Historical Provider, MD  ondansetron (ZOFRAN ODT) 4 MG disintegrating tablet Take 1 tablet (4 mg total) by mouth every 8 (eight) hours as needed for nausea or vomiting. 06/25/14  Yes Everlene Farrier, PA-C  polyethylene glycol (MIRALAX / GLYCOLAX) packet Take 17 g by mouth daily.  04/30/13  Yes Historical Provider, MD  predniSONE (DELTASONE) 10 MG tablet Take 10 mg by mouth daily with breakfast.   Yes Historical Provider, MD  HYDROcodone-acetaminophen (NORCO/VICODIN) 5-325 MG per tablet Take 1 tablet by mouth every 6 (six) hours as needed for moderate pain or severe pain. 10/11/14   Lavera Guise, MD   BP 146/71 mmHg  Pulse 76  Temp(Src) 98.2 F (36.8 C) (Oral)  Resp 20  SpO2 93% Physical Exam Physical Exam  Nursing note and vitals reviewed. Constitutional: Well developed, well nourished, non-toxic, and in no acute distress Head:  Normocephalic and atraumatic.  Mouth/Throat: Oropharynx is clear and moist.  Neck: Normal range of motion. Neck supple.  Cardiovascular: Normal rate and regular rhythm.   Pulmonary/Chest: Effort normal and breath sounds normal.  Abdominal: Soft. There is LLQ suprapubic tenderness tenderness. There is no rebound and no guarding. No CVA tenderness. Musculoskeletal: Normal range of motion. Left paraspinal tenderness. Neurological: Alert, no facial droop, fluent speech, moves all extremities symmetrically Skin: Skin is warm and dry.  Psychiatric: Cooperative  ED Course  Procedures (including critical care time) Labs Review Labs Reviewed  URINALYSIS, ROUTINE W REFLEX MICROSCOPIC (NOT AT [facility]) - Abnormal; Notable for the following:    Hgb urine dipstick TRACE (*)    All other components within normal limits  CBC WITH DIFFERENTIAL/PLATELET - Abnormal; Notable for the following:    RDW 17.9 (*)    Neutrophils Relative % 79 (*)    All other components within normal limits  COMPREHENSIVE METABOLIC PANEL - Abnormal; Notable for the following:    Glucose, Bld 151 (*)    BUN 24 (*)    Creatinine, Ser 1.09 (*)    GFR calc non Af Amer 52 (*)    GFR calc Af Amer 60 (*)    All other components within normal limits  URINE MICROSCOPIC-ADD ON - Abnormal; Notable for the following:    Squamous Epithelial / LPF FEW (*)    All other components within normal limits  LIPASE, BLOOD    Imaging Review Ct Abdomen Pelvis W Contrast  10/11/2014   CLINICAL DATA:  Left flank and lower abdominal pain beginning this morning.  EXAM: CT ABDOMEN AND PELVIS WITH CONTRAST  TECHNIQUE: Multidetector CT imaging of the abdomen and pelvis was performed using the standard protocol following bolus administration of intravenous contrast.  CONTRAST:  OMNIPAQUE IOHEXOL 300 MG/ML  SOLN  COMPARISON:  Abdominal ultrasound 06/25/2014  FINDINGS: Subsegmental atelectasis is present in the lung bases.  No focal liver lesion is  seen. The gallbladder surgically absent. Mild intrahepatic and extrahepatic biliary dilatation is again seen and may be related to prior cholecystectomy. The spleen, right adrenal gland, and pancreas are unremarkable. There is minimal nodular thickening of the left adrenal gland. Small bilateral renal cysts are noted measure up to 1.6 cm on the left and 1.2 cm on the right. Small regions of scarring are noted in both kidneys.  There may be a tiny sliding hiatal hernia. Oral contrast is present in multiple nondilated loops of small bowel without evidence of obstruction. No bowel wall thickening is seen. The appendix is absent.  Bladder is unremarkable. Uterus is absent. Ovaries are unremarkable. Moderate aortic atherosclerosis is noted. No free fluid or enlarged lymph nodes are identified. Mild lumbar spondylosis is noted. A small focus of hazy density in the subcutaneous fat of the anterior abdominal wall may be postsurgical.  IMPRESSION: No acute abnormality or etiology of abdominal pain identified.   Electronically Signed   By: Freida Busman  Mosetta Putt M.D.   On: 10/11/2014 14:27   I have personally reviewed and evaluated these images and lab results as part of my medical decision-making.    MDM   Final diagnoses:  Left-sided low back pain without sciatica  Left lower quadrant pain    64 year old female with history of lupus who presents with abdominal pain and low back pain. She is well-appearing in no acute distress on presentation. Vital signs are within normal limits. Abdomen is soft and nonsurgical. Evidence of low abdominal tenderness to palpation. Concern for possible diverticulitis versus acute other abdominal processes. Blood work including CBC, comp metabolic panel, lipase, and urinalysis are overall unremarkable. CT abdomen pelvis was performed showing no acute intra-abdominal processes. Pain has been well controlled and she is able to tolerate oral intake. Possible musculoskeletal etiology of pain is  she reports it is reproducible with movement. This may be continuous with her chronic low back pain, which she says is unchanged. She will follow-up with her PCP within one to days for repeat exam and further management. Strict return instructions are reviewed. She expressed understanding of all discharge instructions for comfortable to plan of care.  Lavera Guise, MD 10/11/14 1733  Lavera Guise, MD 10/11/14 [phone]

## 2014-10-11 NOTE — ED Notes (Signed)
Bed: BJ47 Expected date:  Expected time:  Means of arrival:  Comments: 64 yo EMS

## 2014-10-11 NOTE — ED Notes (Signed)
Charge is drawing blood

## 2014-10-13 ENCOUNTER — Ambulatory Visit: Payer: Medicare Other | Attending: Orthopedic Surgery | Admitting: Physical Therapy

## 2014-10-13 DIAGNOSIS — R29898 Other symptoms and signs involving the musculoskeletal system: Secondary | ICD-10-CM | POA: Diagnosis present

## 2014-10-13 DIAGNOSIS — R262 Difficulty in walking, not elsewhere classified: Secondary | ICD-10-CM | POA: Diagnosis present

## 2014-10-13 DIAGNOSIS — M25562 Pain in left knee: Secondary | ICD-10-CM | POA: Diagnosis not present

## 2014-10-13 DIAGNOSIS — M25552 Pain in left hip: Secondary | ICD-10-CM | POA: Insufficient documentation

## 2014-10-13 DIAGNOSIS — R269 Unspecified abnormalities of gait and mobility: Secondary | ICD-10-CM | POA: Diagnosis present

## 2014-10-13 NOTE — Therapy (Signed)
[facility] [address]  [address] [place], [place], [address] Phone: [phone]   Fax:  [phone]  Physical Therapy Evaluation  Patient Details  Name: Anna Lester MRN: [id number] Date of Birth: [birth year] Referring Provider:  Samson Frederic, MD  Encounter Date: 10/13/2014      PT End of Session - 10/13/14 1232    Visit Number 1   Number of Visits 16   Date for PT Re-Evaluation 12/08/14   PT Start Time 1100   PT Stop Time 1158   PT Time Calculation (min) 58 min   Activity Tolerance Patient tolerated treatment well;Patient limited by pain   Behavior During Therapy [facility] for tasks assessed/performed      Past Medical History  Diagnosis Date  . SVT (supraventricular tachycardia)   . SLE (systemic lupus erythematosus)   . Essential hypertension   . Diabetes mellitus, type 2   . S/P radiofrequency ablation operation for arrhythmia   . Lupus   . Palpitations   . Chest pain   . Thyroid disease     enlarged thyroid, followed by PCP    Past Surgical History  Procedure Laterality Date  . Appendectomy    . Abdominal hysterectomy    . Hernia repair    . Esophageal dilation      There were no vitals filed for this visit.  Visit Diagnosis:  Left lateral knee pain  Left hip pain  Left leg weakness  Difficulty walking  Abnormality of gait      Subjective Assessment - 10/13/14 1104    Subjective Patient reports a fall on 10/01/14 when she caught her foot in a crack in the stairs causing her to fall down 2 steps, fracturing her left proximal fibula. Patient states MD told her she could WBAT but to wear knee immobilizer brace when walking. Patient states since she has been walking in brace with crutch, her left hip has become very painful, so much so that she went to ED on 10/11/14.   Pertinent History 10/01/14 - Fibular head fracture   Limitations Standing   How long can you stand comfortably? 5 minutes  (limitation from both left leg and back)   Diagnostic tests 10/01/14 x-ray: Acute mildly distracted fracture of the left fibular head. Advanced medial compartment osteoarthritis.   Patient Stated Goals "To get around like normal without pain."   Currently in Pain? Yes   Pain Score 7    Pain Location Knee   Pain Orientation Left;Lateral   Pain Descriptors / Indicators Throbbing   Pain Type Acute pain;Chronic pain  H/o chronic knee pain from OA, but more intense since fracture   Pain Onset 1 to 4 weeks ago   Pain Frequency Constant   Aggravating Factors  standing/weight bearing   Pain Relieving Factors relaxing, Alieve   Effect of Pain on Daily Activities Standing to wash dishes or cook   Multiple Pain Sites Yes   Pain Score 9   Pain Location Hip   Pain Orientation Left;Lateral   Pain Descriptors / Indicators Squeezing   Pain Type Acute pain   Pain Onset 1 to 4 weeks ago   Pain Frequency Constant   Aggravating Factors  Turning in bed, moving   Pain Relieving Factors minimal relief from Alieve   Effect of Pain on Daily Activities Standing, moving around, walking            [facility] PT Assessment - 10/13/14 1100    Assessment  Medical Diagnosis Left fibular head fracture   Onset Date/Surgical Date 10/01/14   Next MD Visit 10/21/14   Precautions   Required Braces or Orthoses Knee Immobilizer - Left   Restrictions   Weight Bearing Restrictions Yes   LLE Weight Bearing Weight bearing as tolerated   Balance Screen   Has the patient fallen in the past 6 months Yes   How many times? 1  Caught foot in crack on steps and fell down 2 steps   Has the patient had a decrease in activity level because of a fear of falling?  No   Is the patient reluctant to leave their home because of a fear of falling?  No   Home Tourist information centre manager residence   Living Arrangements Other (Comment)  Grandson   Available Help at Discharge Family   Type of Home Apartment   Home Access  Stairs to enter   Entrance Stairs-Number of Steps 3   Entrance Stairs-Rails None   Home Layout Two level;Bed/bath upstairs   Alternate Level Stairs-Number of Steps 14   Alternate Level Stairs-Rails Right   Home Equipment [place] - single point;Crutches   Prior Function   Level of Independence Independent   Vocation Retired   Pension scheme manager, rading, Financial risk analyst, clean   Observation/Other Assessments   Focus on Therapeutic Outcomes (FOTO)  34% (66% limitation); Predicted 56% (44% limitation)   Posture/Postural Control   Posture/Postural Control Postural limitations   Posture Comments Right lateral trunk lean in standing and walking with single axillary crutch   ROM / Strength   AROM / PROM / Strength AROM;PROM;Strength   AROM   AROM Assessment Site Knee   Right/Left Knee Left;Right   Right Knee Extension -3   Right Knee Flexion 124   Left Knee Extension 10  sitting   Left Knee Flexion 53  measured in supine, limited by pain; 114 in sitting   PROM   PROM Assessment Site Knee   Right/Left Knee Left   Left Knee Extension -2   Left Knee Flexion 114   Strength   Strength Assessment Site Hip;Knee   Right/Left Hip Left;Right   Right Hip Flexion 4/5   Right Hip Extension 4-/5   Right Hip ABduction 4/5   Right Hip ADduction 4-/5   Left Hip Flexion 3-/5   Left Hip Extension 3-/5   Left Hip ABduction 3-/5   Left Hip ADduction 3-/5   Right/Left Knee Left;Right   Right Knee Flexion 4+/5   Right Knee Extension 4+/5   Left Knee Flexion 3+/5   Left Knee Extension 3+/5   Flexibility   Soft Tissue Assessment /Muscle Length yes   Hamstrings Tight bilaterally, Lt > Rt   ITB Tight bilaterally, Lt > Rt   Palpation   Palpation comment TTP over left greater trochanter bursa   Special Tests    Special Tests Hip Special Tests   Hip Special Tests  Ober's Test   Ober's Test   Findings Positive   Side Left   Comments Unable to test Rt secondary to too painful to lay on Lt hip   Ambulation/Gait    Assistive device R Axillary Crutch   Gait Pattern Decreased weight shift to left;Left circumduction;Left hip hike;Lateral trunk lean to right   Gait Comments Axillary crutch 2" too short, therefore height adjusted and patient instructed in more symmetrical gait pattern focusing on decreased hip and trunk lean to right  [facility] Adult PT Treatment/Exercise - 10/13/14 1100    Exercises   Exercises Knee/Hip   Knee/Hip Exercises: Stretches   Passive Hamstring Stretch Left;20 seconds;3 reps   Passive Hamstring Stretch Limitations Supine with strap   ITB Stretch Left;20 seconds;3 reps   ITB Stretch Limitations Supine with strap   Knee/Hip Exercises: Seated   Long Arc Quad Left;10 reps  3" hold   Heel Slides Left;10 reps   Knee/Hip Exercises: Supine   Short Arc Quad Sets Left;10 reps  3" hold   Straight Leg Raises Left;AAROM;10 reps  3" hold                PT Education - 10/13/14 1214    Education provided Yes   Education Details Initial HEP (See Pt Instructions); Gait training with single axillary crutch; Eval findings and PT POC   Person(s) Educated Patient   Methods Explanation;Demonstration;Handout   Comprehension Verbalized understanding;Returned demonstration;Need further instruction          PT Short Term Goals - 10/13/14 1233    PT SHORT TERM GOAL #1   Title Independent with initial HEP (10/27/14)   Time 2   Period Weeks   Status New           PT Long Term Goals - 10/13/14 1233    PT LONG TERM GOAL #1   Title Independent with advanced HEP (12/08/14)   Time 8   Period Weeks   Status New   PT LONG TERM GOAL #2   Title Left AROM WFL to allow normal gait pattern and stair ascent/descent (12/08/14)   Time 8   Period Weeks   Status New   PT LONG TERM GOAL #3   Title Left LE strength 4/5 or greater for improved stability with ambulation (12/08/14)   Time 8   Period Weeks   Status New   PT LONG TERM GOAL #4   Title Patient will  report pain no greater than 4/10 in left hip or knee (12/08/14)   Time 8   Period Weeks   Status New   PT LONG TERM GOAL #5   Title Patient will ambulate without AD with normal gait pattern on all surfaces without pain or instability (12/08/14)   Time 8   Period Weeks   Status New               Plan - 10/13/14 1219    Clinical Impression Statement Patient is a 64 y/o female who presents to OP PT ~2 weeks s/p fall on 10/01/14 resulting in closed fracture of left fibular head. Patient presents ambulating WBAT on left with a single right axillary crutch and long leg knee immobilzer on left, with crutch ~2" too short causing her to lean significantly to the right while ambulating with decreased weight shift and weight bearing on left despite WBAT status. Crutch height adjusted and gait training provided to normalize gait pattern. Patient reporting significant pain in both left lateral knee, associated with fibular head fracture, and left lateral hip which appears to be the result of greater trochanteric bursitis most likely secondary to abnormal gait pattern resulting from use of crutch and knee immobilizer along with tight hip muscles and left LE weakness. Left knee AROM limited by pain and weakness but PROM WFL.    Pt will benefit from skilled therapeutic intervention in order to improve on the following deficits Pain;Impaired flexibility;Decreased range of motion;Decreased strength;Postural dysfunction;Improper body mechanics;Difficulty walking;Abnormal gait;Decreased balance;Decreased activity tolerance;Decreased knowledge of use of  DME   PT Frequency 2x / week   PT Duration 8 weeks   PT Treatment/Interventions Therapeutic exercise;Passive range of motion;Manual techniques;Therapeutic activities;Functional mobility training;DME Instruction;Gait training;Stair training;Ultrasound;Electrical Stimulation;Cryotherapy;Iontophoresis 4mg /ml Dexamethasone;Taping;Balance training;Patient/family education    PT Next Visit Plan Review of HEP; modalities ([place] +/- ionto) and stretching for left hip bursitis; Left knee AROM, [place] strengthening   Consulted and Agree with Plan of Care Patient          G-Codes - 11/07/2014 1239    Functional Assessment Tool Used FOTO = 34% (66% limitation)   Functional Limitation Mobility: Walking and moving around   Mobility: Walking and Moving Around Current Status ([id number]) At least 60 percent but less than 80 percent impaired, limited or restricted   Mobility: Walking and Moving Around Goal Status [phone]) At least 40 percent but less than 60 percent impaired, limited or restricted  Predicted FOTO = 56% (44% limitation)       Problem List Patient Active Problem List   Diagnosis Date Noted  . Abnormal EKG 04/26/2014  . Chest pain 04/26/2014  . SVT (supraventricular tachycardia)   . SLE (systemic lupus erythematosus)   . Essential hypertension   . Diabetes mellitus, type 2   . S/P radiofrequency ablation operation for arrhythmia     Marry Guan, PT, MPT 11/07/14, 12:43 PM  [facility] [address]  [address] [place], [place], [address] Phone: [phone]   Fax:  [phone]

## 2014-10-15 ENCOUNTER — Ambulatory Visit: Payer: Medicare Other | Admitting: Physical Therapy

## 2014-10-15 DIAGNOSIS — R269 Unspecified abnormalities of gait and mobility: Secondary | ICD-10-CM

## 2014-10-15 DIAGNOSIS — M25552 Pain in left hip: Secondary | ICD-10-CM

## 2014-10-15 DIAGNOSIS — M25562 Pain in left knee: Secondary | ICD-10-CM | POA: Diagnosis not present

## 2014-10-15 DIAGNOSIS — R262 Difficulty in walking, not elsewhere classified: Secondary | ICD-10-CM

## 2014-10-15 NOTE — Therapy (Signed)
[facility] [address]  [address] [place], [place], [address] Phone: [phone]   Fax:  [phone]  Physical Therapy Treatment  Patient Details  Name: Anna Lester MRN: [id number] Date of Birth: July 09, 1950 Referring Provider:  Samson Frederic, MD  Encounter Date: 10/15/2014      PT End of Session - 10/15/14 0835    Visit Number 2   Number of Visits 16   Date for PT Re-Evaluation 12/08/14   PT Start Time 0836   PT Stop Time 0942   PT Time Calculation (min) 66 min   Activity Tolerance Patient tolerated treatment well;Patient limited by pain   Behavior During Therapy [facility] for tasks assessed/performed      Past Medical History  Diagnosis Date  . SVT (supraventricular tachycardia)   . SLE (systemic lupus erythematosus)   . Essential hypertension   . Diabetes mellitus, type 2   . S/P radiofrequency ablation operation for arrhythmia   . Lupus   . Palpitations   . Chest pain   . Thyroid disease     enlarged thyroid, followed by PCP    Past Surgical History  Procedure Laterality Date  . Appendectomy    . Abdominal hysterectomy    . Hernia repair    . Esophageal dilation      There were no vitals filed for this visit.  Visit Diagnosis:  Left lateral knee pain  Left hip pain  Difficulty walking  Abnormality of gait      Subjective Assessment - 10/15/14 0836    Subjective Patient reports increased pain yesterday and today. She was up on it a lot yesterday and then she twisted it when she stood and had increased pain in the knee up to the hip and down to the foot. She took two Aleve and that helped some.   Currently in Pain? Yes   Pain Score 10-Worst pain ever   Pain Location Knee   Pain Orientation Left;Lateral   Multiple Pain Sites Yes   Pain Score 10   Pain Location Hip   Pain Orientation Left;Lateral                         [facility] Adult PT Treatment/Exercise - 10/15/14 0001    Ambulation/Gait   Assistive device R Axillary Crutch   Gait Pattern Decreased step length - left;Decreased stance time - right   Ambulation Surface Level   Pre-Gait Activities toe/heel and side to side weight shifting at counter   Gait Comments worked on eliminating circumduction by slowing gait speed as well as correct heel strike   Exercises   Exercises --  reviewed HEP   Modalities   Modalities Electrical Stimulation;Ultrasound   Programme researcher, broadcasting/film/video Location L hip and leg   Electrical Stimulation Action IFC 80-150 Hz   Electrical Stimulation Parameters to tolerance x 15 min   Electrical Stimulation Goals Pain   Ultrasound   Ultrasound Location L gluteals and lateral hip   Ultrasound Parameters 1.5 w/cm2 1 mHz cont x 10 min   Ultrasound Goals Pain   Manual Therapy   Manual Therapy Soft tissue mobilization;Myofascial release   Soft tissue mobilization to left gluteals, hip rotators   Myofascial Release L ITB; TPR to L hip flexor with AA hip flexion in hooklying                PT Education - 10/15/14 1235    Education  provided Yes   Education Details HEP: weight shifting heel/toe, side to side at counter   Person(s) Educated Patient   Methods Explanation;Demonstration;Verbal cues   Comprehension Returned demonstration;Verbalized understanding          PT Short Term Goals - 10/13/14 1233    PT SHORT TERM GOAL #1   Title Independent with initial HEP (10/27/14)   Time 2   Period Weeks   Status New           PT Long Term Goals - 10/13/14 1233    PT LONG TERM GOAL #1   Title Independent with advanced HEP (12/08/14)   Time 8   Period Weeks   Status New   PT LONG TERM GOAL #2   Title Left AROM WFL to allow normal gait pattern and stair ascent/descent (12/08/14)   Time 8   Period Weeks   Status New   PT LONG TERM GOAL #3   Title Left LE strength 4/5 or greater for improved stability with ambulation (12/08/14)   Time 8   Period  Weeks   Status New   PT LONG TERM GOAL #4   Title Patient will report pain no greater than 4/10 in left hip or knee (12/08/14)   Time 8   Period Weeks   Status New   PT LONG TERM GOAL #5   Title Patient will ambulate without AD with normal gait pattern on all surfaces without pain or instability (12/08/14)   Time 8   Period Weeks   Status New               Plan - 10/15/14 1236    Clinical Impression Statement Patient presented with increased pain to 10/10 today and was limited initially during treatment with ability to perform TE. She responded well to [place] and estim with decreased pain to 7/10 after treatment. She was able to eliminate hip ER and circumduction with gait with VCs and following pregait activities. Marked tenderness of Right gluts and ITB today limiting therex and STW.   PT Next Visit Plan modalities prn for pain; manual and  stretching for left hip bursitis; Left knee AROM, OKC strengthening        Problem List Patient Active Problem List   Diagnosis Date Noted  . Abnormal EKG 04/26/2014  . Chest pain 04/26/2014  . SVT (supraventricular tachycardia)   . SLE (systemic lupus erythematosus)   . Essential hypertension   . Diabetes mellitus, type 2   . S/P radiofrequency ablation operation for arrhythmia     Solon Palm PT  10/15/2014, 12:43 PM  [facility] [address]  [address] [place], [place], [address] Phone: [phone]   Fax:  [phone]

## 2014-10-20 ENCOUNTER — Ambulatory Visit: Payer: Medicare Other | Admitting: Rehabilitation

## 2014-10-20 DIAGNOSIS — R29898 Other symptoms and signs involving the musculoskeletal system: Secondary | ICD-10-CM

## 2014-10-20 DIAGNOSIS — M25562 Pain in left knee: Secondary | ICD-10-CM

## 2014-10-20 DIAGNOSIS — M25552 Pain in left hip: Secondary | ICD-10-CM

## 2014-10-20 DIAGNOSIS — R262 Difficulty in walking, not elsewhere classified: Secondary | ICD-10-CM

## 2014-10-20 DIAGNOSIS — R269 Unspecified abnormalities of gait and mobility: Secondary | ICD-10-CM

## 2014-10-20 NOTE — Therapy (Addendum)
[facility] [address]  [place], [place], [address] Phone: [phone]   Fax:  [phone]  Physical Therapy Treatment  Patient Details  Name: Anna Lester MRN: [id number] Date of Birth: 10-09-1950 Referring Provider:  Rod Can, MD  Encounter Date: 10/20/2014      PT End of Session - 10/20/14 1010    Visit Number 3   Number of Visits 16   Date for PT Re-Evaluation 12/08/14   PT Start Time 1010   PT Stop Time 1106   PT Time Calculation (min) 56 min   Activity Tolerance Patient tolerated treatment well   Behavior During Therapy [facility] for tasks assessed/performed      Past Medical History  Diagnosis Date  . SVT (supraventricular tachycardia)   . SLE (systemic lupus erythematosus)   . Essential hypertension   . Diabetes mellitus, type 2   . S/P radiofrequency ablation operation for arrhythmia   . Lupus   . Palpitations   . Chest pain   . Thyroid disease     enlarged thyroid, followed by PCP    Past Surgical History  Procedure Laterality Date  . Appendectomy    . Abdominal hysterectomy    . Hernia repair    . Esophageal dilation      There were no vitals filed for this visit.  Visit Diagnosis:  Left lateral knee pain  Left hip pain  Difficulty walking  Abnormality of gait  Left leg weakness      Subjective Assessment - 10/20/14 1012    Subjective Reports feeling much better since last visit.    Currently in Pain? Yes   Pain Score 6    Pain Location Knee   Pain Orientation Left;Lateral   Pain Score 6   Pain Location Hip   Pain Orientation Left;Lateral            [facility] PT Assessment - 10/20/14 1022    AROM   Left Knee Flexion 54  in supine, limited by pain                [facility] Adult PT Treatment/Exercise - 10/20/14 1016    Exercises   Exercises Knee/Hip   Knee/Hip Exercises: Supine   Quad Sets AROM;Left;10 reps   Quad Sets Limitations 5" hold   Heel  Slides AAROM;Both;10 reps   Heel Slides Limitations Feet on peanut ball   Straight Leg Raises Left;AAROM;2 sets;5 reps   Modalities   Modalities Electrical Stimulation;Ultrasound   Electrical Stimulation   Electrical Stimulation Location L hip and leg   Electrical Stimulation Action IFC (80-150) x15'   Electrical Stimulation Parameters to tolerance in sidelying   Electrical Stimulation Goals Pain   Ultrasound   Ultrasound Location Lt glutes and lateral hip   Ultrasound Parameters 1.5w/cm2, 1 mHz, 100%, x10'   Ultrasound Goals Pain   Manual Therapy   Manual Therapy Soft tissue mobilization   Soft tissue mobilization Lt glutes, piriformis, Lt ITB in sidelying                  PT Short Term Goals - 10/20/14 1056    PT SHORT TERM GOAL #1   Title Independent with initial HEP (10/27/14)   Status On-going           PT Long Term Goals - 10/20/14 1056    PT LONG TERM GOAL #1   Title Independent with advanced HEP (12/08/14)   Status On-going   PT LONG  TERM GOAL #2   Title Left AROM WFL to allow normal gait pattern and stair ascent/descent (12/08/14)   Status On-going   PT LONG TERM GOAL #3   Title Left LE strength 4/5 or greater for improved stability with ambulation (12/08/14)   Status On-going   PT LONG TERM GOAL #4   Title Patient will report pain no greater than 4/10 in left hip or knee (12/08/14)   Status On-going   PT LONG TERM GOAL #5   Title Patient will ambulate without AD with normal gait pattern on all surfaces without pain or instability (12/08/14)   Status On-going               Plan - 10/20/14 1053    Clinical Impression Statement Pt still very tight and tender with lateral hip/glute pain but reports this is better than last time. States her hip limits her the most. Continued with modalities and STM in addition to slight therex work today. No changes in ROM seen yet with limitation of pain. Improved ambulation noted with single axillary crutch today.  No progress note sent to MD due to no changes in motion and only 3rd visit.    PT Next Visit Plan modalities prn for pain; manual and  stretching for left hip bursitis; Left knee AROM, OKC strengthening   Consulted and Agree with Plan of Care Patient        Problem List Patient Active Problem List   Diagnosis Date Noted  . Abnormal EKG 04/26/2014  . Chest pain 04/26/2014  . SVT (supraventricular tachycardia)   . SLE (systemic lupus erythematosus)   . Essential hypertension   . Diabetes mellitus, type 2   . S/P radiofrequency ablation operation for arrhythmia     Barbette Hair, PTA 10/20/2014, 10:58 AM  [facility] [address]  [place], [place], [address] Phone: [phone]   Fax:  [phone]     PHYSICAL THERAPY DISCHARGE SUMMARY  Visits from Start of Care: 3  Current functional level related to goals / functional outcomes:   Initial therapy tolerance limited by left lateral hip and gluteal pain stemming from altered gait pattern and compensations from using single axillary crutch, with initial therapy focus diverted to [facility] and modalities for pain management and correction of gait pattern. Patient remained very tight and tender in gluts and ITB, but was reporting improvement and was just beginning to be able to tolerate initiation of therapeutic exercise for left knee AA/AROM when she had to stop therapy because she had to find a new place to live as her current home was condemned. Therapy was placed on hold x 30 days while patient worked on resolving her living situation. At last contact with patient, living situation remains unresolved and patient unable to return to PT, therefore will proceed with discharge from PT for this episode.   Remaining deficits:   Pain/tightness in lateral and posterior left hip musculature; limited left knee ROM and strength; abnormal gait pattern with continued need for AD.    Education / Equipment:   HEP Plan: Patient agrees to discharge.  Patient goals were not met. Patient is being discharged due to                                                     ?????  above reasons.       Percival Spanish, PT, MPT 12/10/2014, 12:12 PM  [facility] [address]  [place], [place], [address] Phone: [phone]   Fax:  [phone]

## 2014-10-26 ENCOUNTER — Ambulatory Visit: Payer: Medicare Other | Admitting: Rehabilitation

## 2014-10-28 ENCOUNTER — Ambulatory Visit: Payer: Medicare Other | Admitting: Physical Therapy

## 2014-11-02 ENCOUNTER — Ambulatory Visit: Payer: Medicare Other | Admitting: Rehabilitation

## 2014-11-05 ENCOUNTER — Ambulatory Visit: Payer: Medicare Other | Attending: Orthopedic Surgery | Admitting: Physical Therapy

## 2014-11-11 ENCOUNTER — Ambulatory Visit: Payer: Medicare Other | Admitting: Physical Therapy

## 2015-01-31 DIAGNOSIS — K589 Irritable bowel syndrome without diarrhea: Secondary | ICD-10-CM

## 2015-01-31 HISTORY — DX: Irritable bowel syndrome, unspecified: K58.9

## 2015-06-13 ENCOUNTER — Emergency Department (HOSPITAL_BASED_OUTPATIENT_CLINIC_OR_DEPARTMENT_OTHER): Payer: Medicare Other

## 2015-06-13 ENCOUNTER — Emergency Department (HOSPITAL_BASED_OUTPATIENT_CLINIC_OR_DEPARTMENT_OTHER)
Admission: EM | Admit: 2015-06-13 | Discharge: 2015-06-13 | Disposition: A | Discharge status: Other Acute Inpatient Hospital | Payer: Medicare Other | Attending: Emergency Medicine | Admitting: Emergency Medicine

## 2015-06-13 ENCOUNTER — Encounter (HOSPITAL_BASED_OUTPATIENT_CLINIC_OR_DEPARTMENT_OTHER): Payer: Self-pay | Admitting: *Deleted

## 2015-06-13 DIAGNOSIS — Z7982 Long term (current) use of aspirin: Secondary | ICD-10-CM | POA: Diagnosis not present

## 2015-06-13 DIAGNOSIS — M5442 Lumbago with sciatica, left side: Secondary | ICD-10-CM | POA: Insufficient documentation

## 2015-06-13 DIAGNOSIS — M79605 Pain in left leg: Secondary | ICD-10-CM | POA: Diagnosis present

## 2015-06-13 DIAGNOSIS — R531 Weakness: Secondary | ICD-10-CM

## 2015-06-13 DIAGNOSIS — Z87891 Personal history of nicotine dependence: Secondary | ICD-10-CM | POA: Insufficient documentation

## 2015-06-13 DIAGNOSIS — I10 Essential (primary) hypertension: Secondary | ICD-10-CM | POA: Insufficient documentation

## 2015-06-13 DIAGNOSIS — Z79899 Other long term (current) drug therapy: Secondary | ICD-10-CM | POA: Diagnosis not present

## 2015-06-13 DIAGNOSIS — E119 Type 2 diabetes mellitus without complications: Secondary | ICD-10-CM | POA: Diagnosis not present

## 2015-06-13 HISTORY — DX: Irritable bowel syndrome without diarrhea: K58.9

## 2015-06-13 LAB — CBC
HCT: 35.1 % — ABNORMAL LOW (ref 36.0–46.0)
Hemoglobin: 11.5 g/dL — ABNORMAL LOW (ref 12.0–15.0)
MCH: 27.3 pg (ref 26.0–34.0)
MCHC: 32.8 g/dL (ref 30.0–36.0)
MCV: 83.2 fL (ref 78.0–100.0)
Platelets: 128 10*3/uL — ABNORMAL LOW (ref 150–400)
RBC: 4.22 MIL/uL (ref 3.87–5.11)
RDW: 17.3 % — ABNORMAL HIGH (ref 11.5–15.5)
WBC: 11 10*3/uL — ABNORMAL HIGH (ref 4.0–10.5)

## 2015-06-13 LAB — RAPID URINE DRUG SCREEN, HOSP PERFORMED
Amphetamines: NOT DETECTED
Barbiturates: NOT DETECTED
Benzodiazepines: NOT DETECTED
Cocaine: NOT DETECTED
Opiates: NOT DETECTED
Tetrahydrocannabinol: NOT DETECTED

## 2015-06-13 LAB — COMPREHENSIVE METABOLIC PANEL
ALT: 38 U/L (ref 14–54)
AST: 30 U/L (ref 15–41)
Albumin: 3.7 g/dL (ref 3.5–5.0)
Alkaline Phosphatase: 88 U/L (ref 38–126)
Anion gap: 11 (ref 5–15)
BUN: 14 mg/dL (ref 6–20)
CO2: 27 mmol/L (ref 22–32)
Calcium: 8.9 mg/dL (ref 8.9–10.3)
Chloride: 103 mmol/L (ref 101–111)
Creatinine, Ser: 0.71 mg/dL (ref 0.44–1.00)
GFR calc Af Amer: >60 mL/min (ref >60)
GFR calc non Af Amer: >60 mL/min (ref >60)
Glucose, Bld: 109 mg/dL — ABNORMAL HIGH (ref 65–99)
Potassium: 2.8 mmol/L — ABNORMAL LOW (ref 3.5–5.1)
Sodium: 141 mmol/L (ref 135–145)
Total Bilirubin: 0.5 mg/dL (ref 0.3–1.2)
Total Protein: 6.7 g/dL (ref 6.5–8.1)

## 2015-06-13 LAB — URINALYSIS, ROUTINE W REFLEX MICROSCOPIC
Bilirubin Urine: NEGATIVE
Glucose, UA: NEGATIVE mg/dL
Hgb urine dipstick: NEGATIVE
Ketones, ur: NEGATIVE mg/dL
Nitrite: NEGATIVE
Protein, ur: 30 mg/dL — AB
Specific Gravity, Urine: 1.022 (ref 1.005–1.030)
pH: 7 (ref 5.0–8.0)

## 2015-06-13 LAB — DIFFERENTIAL
Basophils Absolute: 0 10*3/uL (ref 0.0–0.1)
Basophils Relative: 0 %
Eosinophils Absolute: 0.1 10*3/uL (ref 0.0–0.7)
Eosinophils Relative: 1 %
Lymphocytes Relative: 28 %
Lymphs Abs: 3.1 10*3/uL (ref 0.7–4.0)
Monocytes Absolute: 0.7 10*3/uL (ref 0.1–1.0)
Monocytes Relative: 6 %
Neutro Abs: 7.2 10*3/uL (ref 1.7–7.7)
Neutrophils Relative %: 65 %

## 2015-06-13 LAB — ETHANOL: Alcohol, Ethyl (B): <5 mg/dL (ref <5)

## 2015-06-13 LAB — PROTIME-INR
INR: 0.9 (ref 0.00–1.49)
Prothrombin Time: 12.4 seconds (ref 11.6–15.2)

## 2015-06-13 LAB — APTT: aPTT: 23 seconds — ABNORMAL LOW (ref 24–37)

## 2015-06-13 LAB — URINE MICROSCOPIC-ADD ON: RBC / HPF: NONE SEEN RBC/hpf (ref 0–5)

## 2015-06-13 LAB — TROPONIN I: Troponin I: 0.03 ng/mL (ref <0.031)

## 2015-06-13 LAB — CBG MONITORING, ED: GLUCOSE-CAPILLARY: 131 mg/dL — AB (ref 65–99)

## 2015-06-13 MED ORDER — ACETAMINOPHEN 325 MG PO TABS
650.0000 mg | ORAL_TABLET | Freq: Once | ORAL | Status: AC
  Administered 2015-06-13: 650 mg via ORAL
  Filled 2015-06-13: qty 2

## 2015-06-13 MED ORDER — CLOPIDOGREL BISULFATE 75 MG PO TABS
75.0000 mg | ORAL_TABLET | Freq: Once | ORAL | Status: AC
  Administered 2015-06-13: 75 mg via ORAL
  Filled 2015-06-13: qty 1

## 2015-06-13 MED ORDER — POTASSIUM CHLORIDE 10 MEQ/100ML IV SOLN
10.0000 meq | Freq: Once | INTRAVENOUS | Status: AC
  Administered 2015-06-13: 10 meq via INTRAVENOUS
  Filled 2015-06-13: qty 100

## 2015-06-13 NOTE — ED Notes (Signed)
Pt reports hx of back pain that radiates down the L leg for several years. States L arm pain began around 2 weeks -- reports she went to Urgent Care and received tramadol, prednisone and oxycodone which haven't brought her relief.

## 2015-06-13 NOTE — ED Notes (Signed)
Pt transported via [facility] to [facility].

## 2015-06-13 NOTE — ED Notes (Signed)
Patient transported to CT 

## 2015-06-13 NOTE — ED Provider Notes (Signed)
CSN: [id number]     Arrival date & time 06/13/15  2956 History   First MD Initiated Contact with Patient 06/13/15 1009     Chief Complaint  Patient presents with  . Leg Pain  . Cerebrovascular Accident     HPI    65 year old female presents today with chronic back pain. Patient reports that over the last several years she's had left lower back pain. She reports this pain radiates down into her left leg. She notes in April approximately 2 months ago she started having worsening pain; acute worsening over the last week. Patient reports that she's been seen at [facility] for the back pain with plain films, she reports she did not receive results from these films. She reports she continues to have radiation of pain down into her leg diffusely, and decreased sensation to the leg. Patient reports she can feel her leg, has no difficulty with ambulation, no bowel bladder incontinence. Pt does note decreased strength to her right lower extremity and decreased sensation.  Patient denies any significant weight loss, fevers, infectious etiology. She denies any trauma to the back or hips. Patient reports that taking Tylenol alleviates the pain, but does not completely resolve it. She reports pain returns after Tylenol wears off. She was given a injection of steroids into her hip which she reports improvement or symptoms, but only temporarily. She reports she was given a prescription for oxycodone which she does not improve her symptoms. She reports pain is worse with prolonged periods of standing, or flexion of the hip.    Patient notes symptoms have been progressing over the last week and acutely worsened in the last several days. Patient also note she's having pain in her left arm, with decreased sensation and strength. Patient reports tightness in her left trapezius, she reports minor right left sided muscular neck pain. Patient notes that yesterday and the day before she had several episodes of  slurred speech, this was around the same time of the onset of the upper extremity weakness.  Patient reports a history of hypertension, she notes that she is being seen by cardiology for this as they are having difficulty controlling her blood pressure. She denies any signs or symptoms consistent with end organ damage.  Patient presently denies any chest pain, shortness of breath, nausea, vomiting, abdominal pain, fever or chills.   Past Medical History  Diagnosis Date  . SVT (supraventricular tachycardia) (HCC)   . SLE (systemic lupus erythematosus) (HCC)   . Essential hypertension   . Diabetes mellitus, type 2 (HCC)   . S/P radiofrequency ablation operation for arrhythmia   . Lupus (HCC)   . Palpitations   . Chest pain   . Thyroid disease     enlarged thyroid, followed by PCP  . Irritable bowel syndrome 2017   Past Surgical History  Procedure Laterality Date  . Appendectomy    . Abdominal hysterectomy    . Hernia repair    . Esophageal dilation     Family History  Problem Relation Age of Onset  . Colon cancer Mother   . Leukemia Sister    Social History  Substance Use Topics  . Smoking status: Former Smoker    Types: Cigarettes  . Smokeless tobacco: Never Used  . Alcohol Use: No   OB History    No data available     Review of Systems  All other systems reviewed and are negative.   Allergies  Ace inhibitors; Amlodipine; Codeine; Isosorbide  nitrate; Metformin and related; Tramadol; and Nexium  Home Medications   Prior to Admission medications   Medication Sig Start Date End Date Taking? Authorizing Provider  ammonium lactate (LAC-HYDRIN) 12 % lotion Apply 1 application topically 2 (two) times daily. Apply to both feet Indications: Abnormal Dryness of Skin, Eyes or Mucous Membranes 03/04/13   Historical Provider, MD  aspirin 81 MG EC tablet Take 81 mg by mouth daily.  11/20/12   Historical Provider, MD  atorvastatin (LIPITOR) 40 MG tablet Take 40 mg by mouth  daily at 6 PM.  02/13/13   Historical Provider, MD  HYDROcodone-acetaminophen (NORCO/VICODIN) 5-325 MG per tablet Take 1 tablet by mouth every 4 (four) hours as needed. Patient not taking: Reported on 10/13/2014 10/01/14   Glynn OctaveStephen Rancour, MD  HYDROcodone-acetaminophen (NORCO/VICODIN) 5-325 MG per tablet Take 1 tablet by mouth every 6 (six) hours as needed for moderate pain or severe pain. Patient not taking: Reported on 10/13/2014 10/11/14   Lavera Guiseana Duo Liu, MD  losartan-hydrochlorothiazide (HYZAAR) 100-25 MG per tablet Take 1 tablet by mouth daily.  05/21/13   Historical Provider, MD  metoprolol (LOPRESSOR) 100 MG tablet Take 1 tablet (100 mg total) by mouth 2 (two) times daily. 04/27/14   Zannie CovePreetha Joseph, MD  omeprazole (PRILOSEC) 40 MG capsule Take 40 mg by mouth daily.  03/28/13   Historical Provider, MD  ondansetron (ZOFRAN ODT) 4 MG disintegrating tablet Take 1 tablet (4 mg total) by mouth every 8 (eight) hours as needed for nausea or vomiting. 06/25/14   Everlene FarrierWilliam Dansie, PA-C  polyethylene glycol (MIRALAX / GLYCOLAX) packet Take 17 g by mouth daily.  04/30/13   Historical Provider, MD  predniSONE (DELTASONE) 10 MG tablet Take 10 mg by mouth daily with breakfast.    Historical Provider, MD   BP 149/70 mmHg  Pulse 84  Temp(Src) 98.4 F (36.9 C) (Oral)  Resp 15  SpO2 98%   Physical Exam  Constitutional: She is oriented to person, place, and time. She appears well-developed and well-nourished. No distress.  HENT:  Head: Normocephalic and atraumatic.  Eyes: Conjunctivae are normal. Pupils are equal, round, and reactive to light. Right eye exhibits no discharge. Left eye exhibits no discharge. No scleral icterus.  Neck: Normal range of motion. Neck supple. No JVD present. No tracheal deviation present.  Pulmonary/Chest: Effort normal. No stridor.  Abdominal: Soft. She exhibits no distension.  Musculoskeletal: Normal range of motion. She exhibits tenderness. She exhibits no edema.  No C, T, or L spine  tenderness to palpation. No obvious signs of trauma, deformity, infection, step-offs. Lung expansion normal. No scoliosis or kyphosis.   Right leg 5 out of 5 strength, patellar reflexes 2+, sensation intact  Left leg 4/ 5 knee extension, 3/5 dorsiflexion, reduced sensation to the left leg diffusely- no appreciable patellar reflex  Right upper extremity, strength 5 out of 5, sensation intact Left upper extremity strength 4 out of 5, reduced sensation throughout  Ambulates without difficulty      Neurological: She is alert and oriented to person, place, and time. Coordination normal.  Skin: Skin is warm and dry. She is not diaphoretic.  Psychiatric: She has a normal mood and affect. Her behavior is normal. Judgment and thought content normal.  Nursing note and vitals reviewed.   ED Course  Procedures (including critical care time) Labs Review Labs Reviewed  APTT - Abnormal; Notable for the following:    aPTT 23 (*)    All other components within normal limits  CBC -  Abnormal; Notable for the following:    WBC 11.0 (*)    Hemoglobin 11.5 (*)    HCT 35.1 (*)    RDW 17.3 (*)    Platelets 128 (*)    All other components within normal limits  COMPREHENSIVE METABOLIC PANEL - Abnormal; Notable for the following:    Potassium 2.8 (*)    Glucose, Bld 109 (*)    All other components within normal limits  URINALYSIS, ROUTINE W REFLEX MICROSCOPIC (NOT AT [facility]) - Abnormal; Notable for the following:    APPearance CLOUDY (*)    Protein, ur 30 (*)    Leukocytes, UA TRACE (*)    All other components within normal limits  URINE MICROSCOPIC-ADD ON - Abnormal; Notable for the following:    Squamous Epithelial / LPF 6-30 (*)    Bacteria, UA RARE (*)    All other components within normal limits  ETHANOL  PROTIME-INR  DIFFERENTIAL  URINE RAPID DRUG SCREEN, HOSP PERFORMED  TROPONIN I    Imaging Review Dg Lumbar Spine Complete  06/13/2015  CLINICAL DATA:  Low back pain, left hip pain, no  known injury EXAM: LUMBAR SPINE - COMPLETE 4+ VIEW COMPARISON:  02/24/2015 FINDINGS: Five views of the lumbar spine submitted. No acute fracture or subluxation. There is moderate disc space flattening at L4-L5 level. Atherosclerotic calcifications of abdominal aorta and iliac arteries. Mild disc space flattening at L5-S1 level. Postcholecystectomy surgical clips are noted. Mild anterior spurring upper endplate of L3 vertebral body P IMPRESSION: No acute fracture or subluxation. Degenerative changes at L4-L5 and L5-S1 level. Electronically Signed   By: Natasha Mead M.D.   On: 06/13/2015 11:09   Ct Head Wo Contrast  06/13/2015  CLINICAL DATA:  Right upper and lower extremity weakness. EXAM: CT HEAD WITHOUT CONTRAST TECHNIQUE: Contiguous axial images were obtained from the base of the skull through the vertex without intravenous contrast. COMPARISON:  CT head 02/16/2003 FINDINGS: Ventricle size is normal. Negative for acute or chronic infarction. Negative for hemorrhage or fluid collection. Negative for mass or edema. No shift of the midline structures. Calvarium is intact. IMPRESSION: Negative Electronically Signed   By: Marlan Palau M.D.   On: 06/13/2015 13:13   Dg Hip Unilat With Pelvis 2-3 Views Left  06/13/2015  CLINICAL DATA:  Left hip pain radiating to the leg. Symptoms worsening over the last few days. EXAM: DG HIP (WITH OR WITHOUT PELVIS) 2-3V LEFT COMPARISON:  None. FINDINGS: Hip joints appear symmetric and normal. Joint space height within normal limits. No osteophytes. No evidence of avascular necrosis. No other regional abnormality. IMPRESSION: Normal radiographs Electronically Signed   By: Paulina Fusi M.D.   On: 06/13/2015 11:09   I have personally reviewed and evaluated these images and lab results as part of my medical decision-making.   EKG Interpretation   Date/Time:  Sunday Jun 13 2015 12:29:43 EDT Ventricular Rate:  83 PR Interval:  160 QRS Duration: 93 QT Interval:  375 QTC  Calculation: 441 R Axis:   52 Text Interpretation:  Ectopic atrial rhythm Anterolateral infarct, age  indeterminate Repol abnrm, severe global ischemia (LM/MVD) Baseline wander  in lead(s) V6 more pronounced anterior ST depression Confirmed by Manus Gunning   MD, STEPHEN [phone]) on 06/13/2015 4:14:07 PM      MDM   Final diagnoses:  Left-sided weakness  Left-sided low back pain with left-sided sciatica   Labs: Potassium 2.8  Imaging: DG lumbar spine, DG hip unilateral- degenerative changes at L4-L5 and L5-S1, negative hip  Consults:  Therapeutics: Plavix  Discharge Meds:    Assessment/Plan: 65 year old female presents today with numerous complaints. Patient presents with chronic back pain with leg pain. Patient's symptoms have progressed more rapidly over the last week, she notes decreased sensation to the lower extremity, she has objective oh weakness in the leg but ambulates without difficulty. This is likely chronic back pain with acute worsening. Patient also has left upper extremity weakness and sensory deficits; patient complicates picture with the addition of pain to the left upper extremity. It is difficult to determine if this is due to patient effort or true neuro deficit. Patient additionally notes several episodes of slurred speech over the last several days, this coupled with persistent both upper and lower extremity weakness, numbness is concerning for stroke. Patient has a negative head CT here, she will need stroke rule out. Patient is requesting transfer to [facility]. I spoke with hospitalist Dr. Doylene Canning who will accept patient in transfer. He requested the patient receive Plavix as long as there are no contraindications. Patient has had Plavix before in the past without problems, no active bleeding or recent surgeries. Patient will be transferred over. Vital signs remained stable, resting comfortably in exam bed. Patient care was shared with Dr. Sidonie Dickens who personally  evaluated the patient.      Eyvonne Mechanic, PA-C 06/13/15 1654  Glynn Octave, MD 06/14/15 [phone]

## 2015-06-13 NOTE — Discharge Instructions (Signed)
Please follow-up with neurosurgeon for further evaluation and management. If you're unable to be seen this week, please follow-up with her primary care for reevaluation. If any new or worsening signs or symptoms present please return to the emergency room for further evaluation. Please continue using previously prescribed medication as needed.

## 2015-06-13 NOTE — ED Notes (Addendum)
Trey PaulaJeff PA at bedside. Discussed plan of care with patient. Multiple nurse have attempted to start PIV under ultrasound without success. Late entry:pt reports slurred speech about two days ago.

## 2016-01-05 ENCOUNTER — Emergency Department (HOSPITAL_COMMUNITY): Payer: Medicare Other

## 2016-01-05 ENCOUNTER — Observation Stay (HOSPITAL_COMMUNITY)
Admission: EM | Admit: 2016-01-05 | Discharge: 2016-01-07 | Disposition: A | Discharge status: Home / Self Care | Payer: Medicare Other | Attending: Internal Medicine | Admitting: Internal Medicine

## 2016-01-05 ENCOUNTER — Encounter (HOSPITAL_COMMUNITY): Payer: Self-pay | Admitting: Emergency Medicine

## 2016-01-05 DIAGNOSIS — E876 Hypokalemia: Secondary | ICD-10-CM | POA: Diagnosis not present

## 2016-01-05 DIAGNOSIS — E1151 Type 2 diabetes mellitus with diabetic peripheral angiopathy without gangrene: Secondary | ICD-10-CM | POA: Insufficient documentation

## 2016-01-05 DIAGNOSIS — I080 Rheumatic disorders of both mitral and aortic valves: Secondary | ICD-10-CM | POA: Diagnosis not present

## 2016-01-05 DIAGNOSIS — K589 Irritable bowel syndrome without diarrhea: Secondary | ICD-10-CM | POA: Insufficient documentation

## 2016-01-05 DIAGNOSIS — R531 Weakness: Secondary | ICD-10-CM | POA: Diagnosis not present

## 2016-01-05 DIAGNOSIS — Z888 Allergy status to other drugs, medicaments and biological substances status: Secondary | ICD-10-CM | POA: Insufficient documentation

## 2016-01-05 DIAGNOSIS — I10 Essential (primary) hypertension: Secondary | ICD-10-CM | POA: Diagnosis present

## 2016-01-05 DIAGNOSIS — R112 Nausea with vomiting, unspecified: Secondary | ICD-10-CM | POA: Diagnosis not present

## 2016-01-05 DIAGNOSIS — M329 Systemic lupus erythematosus, unspecified: Secondary | ICD-10-CM | POA: Diagnosis not present

## 2016-01-05 DIAGNOSIS — R51 Headache: Secondary | ICD-10-CM | POA: Diagnosis not present

## 2016-01-05 DIAGNOSIS — Z806 Family history of leukemia: Secondary | ICD-10-CM | POA: Insufficient documentation

## 2016-01-05 DIAGNOSIS — R6884 Jaw pain: Secondary | ICD-10-CM | POA: Insufficient documentation

## 2016-01-05 DIAGNOSIS — G3189 Other specified degenerative diseases of nervous system: Secondary | ICD-10-CM | POA: Insufficient documentation

## 2016-01-05 DIAGNOSIS — R202 Paresthesia of skin: Principal | ICD-10-CM | POA: Insufficient documentation

## 2016-01-05 DIAGNOSIS — Z7952 Long term (current) use of systemic steroids: Secondary | ICD-10-CM | POA: Insufficient documentation

## 2016-01-05 DIAGNOSIS — N179 Acute kidney failure, unspecified: Secondary | ICD-10-CM | POA: Diagnosis not present

## 2016-01-05 DIAGNOSIS — E079 Disorder of thyroid, unspecified: Secondary | ICD-10-CM | POA: Diagnosis not present

## 2016-01-05 DIAGNOSIS — Z8679 Personal history of other diseases of the circulatory system: Secondary | ICD-10-CM

## 2016-01-05 DIAGNOSIS — I471 Supraventricular tachycardia: Secondary | ICD-10-CM | POA: Diagnosis not present

## 2016-01-05 DIAGNOSIS — Z9889 Other specified postprocedural states: Secondary | ICD-10-CM

## 2016-01-05 DIAGNOSIS — I6529 Occlusion and stenosis of unspecified carotid artery: Secondary | ICD-10-CM | POA: Insufficient documentation

## 2016-01-05 DIAGNOSIS — E860 Dehydration: Secondary | ICD-10-CM | POA: Diagnosis not present

## 2016-01-05 DIAGNOSIS — Z885 Allergy status to narcotic agent status: Secondary | ICD-10-CM | POA: Insufficient documentation

## 2016-01-05 DIAGNOSIS — M5442 Lumbago with sciatica, left side: Secondary | ICD-10-CM | POA: Diagnosis not present

## 2016-01-05 DIAGNOSIS — R200 Anesthesia of skin: Secondary | ICD-10-CM | POA: Insufficient documentation

## 2016-01-05 DIAGNOSIS — G459 Transient cerebral ischemic attack, unspecified: Secondary | ICD-10-CM | POA: Diagnosis present

## 2016-01-05 DIAGNOSIS — Z87891 Personal history of nicotine dependence: Secondary | ICD-10-CM | POA: Insufficient documentation

## 2016-01-05 DIAGNOSIS — Z6836 Body mass index (BMI) 36.0-36.9, adult: Secondary | ICD-10-CM | POA: Insufficient documentation

## 2016-01-05 DIAGNOSIS — Z7982 Long term (current) use of aspirin: Secondary | ICD-10-CM | POA: Insufficient documentation

## 2016-01-05 DIAGNOSIS — R079 Chest pain, unspecified: Secondary | ICD-10-CM | POA: Insufficient documentation

## 2016-01-05 DIAGNOSIS — G43109 Migraine with aura, not intractable, without status migrainosus: Secondary | ICD-10-CM

## 2016-01-05 DIAGNOSIS — M5432 Sciatica, left side: Secondary | ICD-10-CM | POA: Diagnosis present

## 2016-01-05 DIAGNOSIS — E785 Hyperlipidemia, unspecified: Secondary | ICD-10-CM | POA: Diagnosis not present

## 2016-01-05 DIAGNOSIS — R4781 Slurred speech: Secondary | ICD-10-CM | POA: Insufficient documentation

## 2016-01-05 DIAGNOSIS — R4702 Dysphasia: Secondary | ICD-10-CM | POA: Insufficient documentation

## 2016-01-05 DIAGNOSIS — Z800 Family history of malignant neoplasm of digestive organs: Secondary | ICD-10-CM | POA: Insufficient documentation

## 2016-01-05 DIAGNOSIS — I272 Pulmonary hypertension, unspecified: Secondary | ICD-10-CM | POA: Diagnosis not present

## 2016-01-05 DIAGNOSIS — E119 Type 2 diabetes mellitus without complications: Secondary | ICD-10-CM

## 2016-01-05 DIAGNOSIS — Z794 Long term (current) use of insulin: Secondary | ICD-10-CM | POA: Insufficient documentation

## 2016-01-05 LAB — RAPID URINE DRUG SCREEN, HOSP PERFORMED
AMPHETAMINES: NOT DETECTED
BARBITURATES: NOT DETECTED
BENZODIAZEPINES: NOT DETECTED
COCAINE: NOT DETECTED
Opiates: NOT DETECTED
TETRAHYDROCANNABINOL: NOT DETECTED

## 2016-01-05 NOTE — ED Notes (Signed)
IV nurse will get labs.

## 2016-01-05 NOTE — ED Notes (Signed)
This RN failed to get an IV. Requesting IV team consult.

## 2016-01-05 NOTE — ED Notes (Signed)
IV nurse starting IV 

## 2016-01-05 NOTE — Progress Notes (Signed)
Present to start PIV.  Pt. Not in room, in Xray.  ED RN informed to notify when back in room.

## 2016-01-05 NOTE — ED Triage Notes (Signed)
[facility] [place] [facility] Pt woke up around 1000 this morning with stroke like symptoms. Pt is experiencing slurred speech, difficulty finding her words, headache, and vomiting x3 this AM.

## 2016-01-05 NOTE — ED Provider Notes (Signed)
[facility]-EMERGENCY DEPT Provider Note   CSN: [id number] Arrival date & time: 01/05/16  2104     History   Chief Complaint Chief Complaint  Patient presents with  . Stroke Symptoms    HPI Anna Lester is a 65 y.o. female.  HPI Patient presents feeling bad since around 10:00 this morning when she woke up. States she's having difficulty speaking. She states it felt as if her tongue was thick. She states the words were correct in her head or having difficulty coming out. She also had a headache and developed nausea and vomiting. No diarrhea. States she's had some chills. No cough. She has lower back pain. No dysuria. States she feels weak in both of her legs also. States she just feels bad all over.   Past Medical History:  Diagnosis Date  . Chest pain   . Diabetes mellitus, type 2 (HCC)   . Essential hypertension   . Irritable bowel syndrome 2017  . Lupus   . Palpitations   . S/P radiofrequency ablation operation for arrhythmia   . SLE (systemic lupus erythematosus) (HCC)   . SVT (supraventricular tachycardia) (HCC)   . Thyroid disease    enlarged thyroid, followed by PCP    Patient Active Problem List   Diagnosis Date Noted  . Abnormal EKG 04/26/2014  . Chest pain 04/26/2014  . SVT (supraventricular tachycardia) (HCC)   . SLE (systemic lupus erythematosus) (HCC)   . Essential hypertension   . Diabetes mellitus, type 2 (HCC)   . S/P radiofrequency ablation operation for arrhythmia     Past Surgical History:  Procedure Laterality Date  . ABDOMINAL HYSTERECTOMY    . APPENDECTOMY    . ESOPHAGEAL DILATION    . HERNIA REPAIR      OB History    No data available       Home Medications    Prior to Admission medications   Medication Sig Start Date End Date Taking? Authorizing Provider  ammonium lactate (LAC-HYDRIN) 12 % lotion Apply 1 application topically 2 (two) times daily. Apply to both feet Indications: Abnormal Dryness of Skin, Eyes or Mucous Membranes  03/04/13   Historical Provider, MD  aspirin 81 MG EC tablet Take 81 mg by mouth daily.  11/20/12   Historical Provider, MD  atorvastatin (LIPITOR) 40 MG tablet Take 40 mg by mouth daily at 6 PM.  02/13/13   Historical Provider, MD  HYDROcodone-acetaminophen (NORCO/VICODIN) 5-325 MG per tablet Take 1 tablet by mouth every 4 (four) hours as needed. Patient not taking: Reported on 10/13/2014 10/01/14   Glynn Octave, MD  HYDROcodone-acetaminophen (NORCO/VICODIN) 5-325 MG per tablet Take 1 tablet by mouth every 6 (six) hours as needed for moderate pain or severe pain. Patient not taking: Reported on 10/13/2014 10/11/14   Lavera Guise, MD  losartan-hydrochlorothiazide (HYZAAR) 100-25 MG per tablet Take 1 tablet by mouth daily.  05/21/13   Historical Provider, MD  metoprolol (LOPRESSOR) 100 MG tablet Take 1 tablet (100 mg total) by mouth 2 (two) times daily. 04/27/14   Zannie Cove, MD  omeprazole (PRILOSEC) 40 MG capsule Take 40 mg by mouth daily.  03/28/13   Historical Provider, MD  ondansetron (ZOFRAN ODT) 4 MG disintegrating tablet Take 1 tablet (4 mg total) by mouth every 8 (eight) hours as needed for nausea or vomiting. 06/25/14   Everlene Farrier, PA-C  polyethylene glycol (MIRALAX / GLYCOLAX) packet Take 17 g by mouth daily.  04/30/13   Historical Provider, MD  predniSONE (DELTASONE) 10 MG  tablet Take 10 mg by mouth daily with breakfast.    Historical Provider, MD    Family History Family History  Problem Relation Age of Onset  . Colon cancer Mother   . Leukemia Sister     Social History Social History  Substance Use Topics  . Smoking status: Former Smoker    Types: Cigarettes  . Smokeless tobacco: Never Used  . Alcohol use No     Allergies   Ace inhibitors; Amlodipine; Codeine; Isosorbide nitrate; Metformin and related; Tramadol; and Nexium [esomeprazole]   Review of Systems Review of Systems  Constitutional: Positive for chills. Negative for appetite change.  HENT: Negative for  congestion.   Respiratory: Negative for shortness of breath.   Cardiovascular: Negative for leg swelling.  Gastrointestinal: Negative for abdominal pain.  Genitourinary: Negative for flank pain and urgency.  Musculoskeletal: Positive for back pain.  Skin: Negative for wound.  Neurological: Positive for speech difficulty, weakness and headaches.     Physical Exam Updated Vital Signs BP 151/70   Pulse 85   SpO2 100%   Physical Exam  Constitutional: She appears well-developed.  HENT:  Head: Atraumatic.  Eyes: EOM are normal.  Neck: Neck supple.  Cardiovascular: Normal rate.   Pulmonary/Chest: Effort normal.  Abdominal: Soft. There is no tenderness.  Neurological: She is alert.  Face symmetric. Good grips and bilateral. Extraocular movements intact. Sensation intact in both feet. Patient  does not have good straight leg raise on either side. However when she is attempting to raise her is no downward pressure on the other heel. she states she was able to walk in here okay and that is not consistent with this examination.   Skin: Skin is warm. Capillary refill takes less than 2 seconds.  Psychiatric: She has a normal mood and affect.     ED Treatments / Results  Labs (all labs ordered are listed, but only abnormal results are displayed) Labs Reviewed  ETHANOL  PROTIME-INR  APTT  CBC  DIFFERENTIAL  COMPREHENSIVE METABOLIC PANEL  RAPID URINE DRUG SCREEN, HOSP PERFORMED  URINALYSIS, ROUTINE W REFLEX MICROSCOPIC  I-STAT CHEM 8, ED  I-STAT TROPOININ, ED    EKG  EKG Interpretation  Date/Time:  Wednesday January 05 2016 22:14:04 EST Ventricular Rate:  89 PR Interval:    QRS Duration: 91 QT Interval:  331 QTC Calculation: 403 R Axis:   33 Text Interpretation:  Sinus or ectopic atrial rhythm Abnormal R-wave progression, early transition Inferior infarct, old Abnormal lateral Q waves Confirmed by Rubin PayorPICKERING  MD, Dayami Taitt [id number]) on 01/05/2016 10:48:25 PM        Radiology Dg Chest 2 View  Result Date: 01/05/2016 CLINICAL DATA:  Right-sided chest pain, emesis and jaw pain, onset today. EXAM: CHEST  2 VIEW COMPARISON:  07/07/2014 FINDINGS: The heart size and mediastinal contours are within normal limits. Both lungs are clear. The visualized skeletal structures are unremarkable. IMPRESSION: No active cardiopulmonary disease. Electronically Signed   By: Ellery Plunkaniel R Mitchell M.D.   On: 01/05/2016 22:02   Ct Head Wo Contrast  Result Date: 01/05/2016 CLINICAL DATA:  Weakness, word-finding difficulties, headache and vomiting beginning this morning. History of lupus, hypertension, diabetes. EXAM: CT HEAD WITHOUT CONTRAST TECHNIQUE: Contiguous axial images were obtained from the base of the skull through the vertex without intravenous contrast. COMPARISON:  MRI head Jun 17, 2015 FINDINGS: BRAIN: The ventricles and sulci are normal for age. No intraparenchymal hemorrhage, mass effect nor midline shift. Patchy supratentorial white matter hypodensities within normal range for  patient's age, though non-specific are most compatible with chronic small vessel ischemic disease. No acute large vascular territory infarcts. No abnormal extra-axial fluid collections. Basal cisterns are patent. VASCULAR: Mild calcific atherosclerosis of the carotid siphons. SKULL: No skull fracture. No significant scalp soft tissue swelling. SINUSES/ORBITS: The mastoid air-cells and included paranasal sinuses are well-aerated.The included ocular globes and orbital contents are non-suspicious. OTHER: Similar heterogeneous parotid glands, which may be is secondary to patient's autoimmune disorder. IMPRESSION: No acute intracranial process.  Negative CT HEAD for age. Electronically Signed   By: Awilda Metroourtnay  Bloomer M.D.   On: 01/05/2016 22:47    Procedures Procedures (including critical care time)  Medications Ordered in ED Medications - No data to display   Initial Impression / Assessment and Plan  / ED Course  I have reviewed the triage vital signs and the nursing notes.  Pertinent labs & imaging results that were available during my care of the patient were reviewed by me and considered in my medical decision making (see chart for details).  Clinical Course     Patient presented with headache vomiting and difficulty speaking this morning. Speech is improved. States it was difficulty coming out. Generally weak. Does not have a fever. Will look for signs of infection. Head CT reassuring. Patient would actually like to go home but if no clear cause is found such as an infection she may need to come in for a TIA workup. If infection is found she may be able go home on antibiotics care turned over to Dr. Mora Bellmanni.  Final Clinical Impressions(s) / ED Diagnoses   Final diagnoses:  None    New Prescriptions New Prescriptions   No medications on file     Benjiman CoreNathan Thressa Shiffer, MD 01/05/16 2327

## 2016-01-05 NOTE — ED Notes (Signed)
Patient transported to X-ray 

## 2016-01-05 NOTE — ED Notes (Signed)
Patient transported to CT 

## 2016-01-06 ENCOUNTER — Observation Stay (HOSPITAL_COMMUNITY): Payer: Medicare Other

## 2016-01-06 ENCOUNTER — Encounter (HOSPITAL_COMMUNITY): Payer: Self-pay | Admitting: *Deleted

## 2016-01-06 DIAGNOSIS — G452 Multiple and bilateral precerebral artery syndromes: Secondary | ICD-10-CM

## 2016-01-06 DIAGNOSIS — G43109 Migraine with aura, not intractable, without status migrainosus: Secondary | ICD-10-CM

## 2016-01-06 DIAGNOSIS — N179 Acute kidney failure, unspecified: Secondary | ICD-10-CM | POA: Diagnosis present

## 2016-01-06 DIAGNOSIS — G459 Transient cerebral ischemic attack, unspecified: Secondary | ICD-10-CM | POA: Diagnosis present

## 2016-01-06 DIAGNOSIS — M5432 Sciatica, left side: Secondary | ICD-10-CM | POA: Diagnosis present

## 2016-01-06 LAB — PROTIME-INR
INR: 0.96
Prothrombin Time: 12.7 seconds (ref 11.4–15.2)

## 2016-01-06 LAB — COMPREHENSIVE METABOLIC PANEL
ALBUMIN: 3.8 g/dL (ref 3.5–5.0)
ALT: 24 U/L (ref 14–54)
ANION GAP: 12 (ref 5–15)
AST: 32 U/L (ref 15–41)
Alkaline Phosphatase: 107 U/L (ref 38–126)
BUN: 30 mg/dL — AB (ref 6–20)
CHLORIDE: 106 mmol/L (ref 101–111)
CO2: 23 mmol/L (ref 22–32)
Calcium: 9.8 mg/dL (ref 8.9–10.3)
Creatinine, Ser: 2.01 mg/dL — ABNORMAL HIGH (ref 0.44–1.00)
GFR calc Af Amer: 28 mL/min — ABNORMAL LOW (ref >60)
GFR, EST NON AFRICAN AMERICAN: 24 mL/min — AB (ref >60)
Glucose, Bld: 88 mg/dL (ref 65–99)
POTASSIUM: 3.3 mmol/L — AB (ref 3.5–5.1)
Sodium: 141 mmol/L (ref 135–145)
Total Bilirubin: 0.6 mg/dL (ref 0.3–1.2)
Total Protein: 6.9 g/dL (ref 6.5–8.1)

## 2016-01-06 LAB — DIFFERENTIAL
BASOS ABS: 0 10*3/uL (ref 0.0–0.1)
BASOS PCT: 0 %
EOS ABS: 0.1 10*3/uL (ref 0.0–0.7)
Eosinophils Relative: 1 %
Lymphocytes Relative: 22 %
Lymphs Abs: 2.6 10*3/uL (ref 0.7–4.0)
MONOS PCT: 4 %
Monocytes Absolute: 0.5 10*3/uL (ref 0.1–1.0)
NEUTROS PCT: 73 %
Neutro Abs: 8.5 10*3/uL — ABNORMAL HIGH (ref 1.7–7.7)

## 2016-01-06 LAB — URINALYSIS, MICROSCOPIC (REFLEX)
RBC / HPF: NONE SEEN RBC/hpf (ref 0–5)
WBC UA: NONE SEEN WBC/hpf (ref 0–5)

## 2016-01-06 LAB — CBC
HCT: 36.5 % (ref 36.0–46.0)
Hemoglobin: 11.8 g/dL — ABNORMAL LOW (ref 12.0–15.0)
MCH: 26.6 pg (ref 26.0–34.0)
MCHC: 32.3 g/dL (ref 30.0–36.0)
MCV: 82.4 fL (ref 78.0–100.0)
Platelets: 193 10*3/uL (ref 150–400)
RBC: 4.43 MIL/uL (ref 3.87–5.11)
RDW: 17.3 % — AB (ref 11.5–15.5)
WBC: 11.8 10*3/uL — ABNORMAL HIGH (ref 4.0–10.5)

## 2016-01-06 LAB — URINALYSIS, ROUTINE W REFLEX MICROSCOPIC
Bilirubin Urine: NEGATIVE
Glucose, UA: NEGATIVE mg/dL
Hgb urine dipstick: NEGATIVE
KETONES UR: NEGATIVE mg/dL
NITRITE: NEGATIVE
PH: 5.5 (ref 5.0–8.0)
PROTEIN: NEGATIVE mg/dL
Specific Gravity, Urine: <1.005 — ABNORMAL LOW (ref 1.005–1.030)

## 2016-01-06 LAB — GLUCOSE, CAPILLARY
GLUCOSE-CAPILLARY: 107 mg/dL — AB (ref 65–99)
Glucose-Capillary: 120 mg/dL — ABNORMAL HIGH (ref 65–99)

## 2016-01-06 LAB — CBG MONITORING, ED
GLUCOSE-CAPILLARY: 96 mg/dL (ref 65–99)
Glucose-Capillary: 89 mg/dL (ref 65–99)

## 2016-01-06 LAB — I-STAT CHEM 8, ED
BUN: 30 mg/dL — ABNORMAL HIGH (ref 6–20)
Calcium, Ion: 1.15 mmol/L (ref 1.15–1.40)
Chloride: 106 mmol/L (ref 101–111)
Creatinine, Ser: 2 mg/dL — ABNORMAL HIGH (ref 0.44–1.00)
GLUCOSE: 89 mg/dL (ref 65–99)
HCT: 36 % (ref 36.0–46.0)
HEMOGLOBIN: 12.2 g/dL (ref 12.0–15.0)
POTASSIUM: 3.2 mmol/L — AB (ref 3.5–5.1)
Sodium: 140 mmol/L (ref 135–145)
TCO2: 24 mmol/L (ref 0–100)

## 2016-01-06 LAB — I-STAT TROPONIN, ED: TROPONIN I, POC: 0 ng/mL (ref 0.00–0.08)

## 2016-01-06 LAB — APTT: APTT: 26 s (ref 24–36)

## 2016-01-06 LAB — MAGNESIUM: MAGNESIUM: 1.9 mg/dL (ref 1.7–2.4)

## 2016-01-06 LAB — ETHANOL

## 2016-01-06 MED ORDER — INSULIN GLARGINE 100 UNITS/ML SOLOSTAR PEN
30.0000 [IU] | PEN_INJECTOR | Freq: Every day | SUBCUTANEOUS | Status: DC
  Filled 2016-01-06: qty 3

## 2016-01-06 MED ORDER — LORAZEPAM 2 MG/ML IJ SOLN
2.0000 mg | Freq: Once | INTRAMUSCULAR | Status: AC
  Administered 2016-01-06: 2 mg via INTRAVENOUS
  Filled 2016-01-06: qty 1

## 2016-01-06 MED ORDER — PREDNISONE 5 MG PO TABS
10.0000 mg | ORAL_TABLET | Freq: Every day | ORAL | Status: DC
  Administered 2016-01-06 – 2016-01-07 (×2): 10 mg via ORAL
  Filled 2016-01-06: qty 1
  Filled 2016-01-06: qty 2

## 2016-01-06 MED ORDER — PANTOPRAZOLE SODIUM 40 MG PO TBEC
40.0000 mg | DELAYED_RELEASE_TABLET | Freq: Every day | ORAL | Status: DC
  Administered 2016-01-06 – 2016-01-07 (×2): 40 mg via ORAL
  Filled 2016-01-06 (×2): qty 1

## 2016-01-06 MED ORDER — ASPIRIN 325 MG PO TABS
325.0000 mg | ORAL_TABLET | Freq: Every day | ORAL | Status: DC
  Administered 2016-01-06 – 2016-01-07 (×2): 325 mg via ORAL
  Filled 2016-01-06 (×2): qty 1

## 2016-01-06 MED ORDER — ATORVASTATIN CALCIUM 40 MG PO TABS
40.0000 mg | ORAL_TABLET | Freq: Every day | ORAL | Status: DC
  Administered 2016-01-06: 40 mg via ORAL
  Filled 2016-01-06: qty 1

## 2016-01-06 MED ORDER — DIPHENHYDRAMINE HCL 50 MG/ML IJ SOLN
50.0000 mg | Freq: Once | INTRAMUSCULAR | Status: DC
  Filled 2016-01-06: qty 1

## 2016-01-06 MED ORDER — HYDROCODONE-ACETAMINOPHEN 5-325 MG PO TABS: 1.0000 | ORAL_TABLET | Freq: Four times a day (QID) | ORAL | Status: DC | PRN

## 2016-01-06 MED ORDER — INSULIN GLARGINE 100 UNIT/ML ~~LOC~~ SOLN
30.0000 [IU] | Freq: Every day | SUBCUTANEOUS | Status: DC
  Administered 2016-01-06: 30 [IU] via SUBCUTANEOUS
  Filled 2016-01-06 (×2): qty 0.3

## 2016-01-06 MED ORDER — KETOROLAC TROMETHAMINE 30 MG/ML IJ SOLN
30.0000 mg | Freq: Once | INTRAMUSCULAR | Status: DC
  Filled 2016-01-06: qty 1

## 2016-01-06 MED ORDER — ACETAMINOPHEN 500 MG PO TABS
1000.0000 mg | ORAL_TABLET | Freq: Once | ORAL | Status: AC
  Administered 2016-01-06: 1000 mg via ORAL
  Filled 2016-01-06: qty 2

## 2016-01-06 MED ORDER — SODIUM CHLORIDE 0.9 % IV SOLN
INTRAVENOUS | Status: DC
  Administered 2016-01-06: 09:00:00 via INTRAVENOUS

## 2016-01-06 MED ORDER — ASPIRIN 300 MG RE SUPP: 300.0000 mg | Freq: Every day | RECTAL | Status: DC

## 2016-01-06 MED ORDER — LATANOPROST 0.005 % OP SOLN
1.0000 [drp] | Freq: Every day | OPHTHALMIC | Status: DC
  Administered 2016-01-06: 1 [drp] via OPHTHALMIC
  Filled 2016-01-06: qty 2.5

## 2016-01-06 MED ORDER — LOSARTAN POTASSIUM 50 MG PO TABS: 100.0000 mg | ORAL_TABLET | Freq: Every day | ORAL | Status: DC

## 2016-01-06 MED ORDER — ENOXAPARIN SODIUM 30 MG/0.3ML ~~LOC~~ SOLN
30.0000 mg | SUBCUTANEOUS | Status: DC
  Administered 2016-01-06: 30 mg via SUBCUTANEOUS
  Filled 2016-01-06 (×2): qty 0.3

## 2016-01-06 MED ORDER — METOCLOPRAMIDE HCL 5 MG/ML IJ SOLN
10.0000 mg | Freq: Once | INTRAMUSCULAR | Status: DC
  Filled 2016-01-06: qty 2

## 2016-01-06 MED ORDER — LORAZEPAM 2 MG/ML IJ SOLN
1.0000 mg | Freq: Once | INTRAMUSCULAR | Status: AC
  Administered 2016-01-06: 1 mg via INTRAVENOUS
  Filled 2016-01-06: qty 1

## 2016-01-06 MED ORDER — METOPROLOL TARTRATE 25 MG PO TABS: 100.0000 mg | ORAL_TABLET | Freq: Two times a day (BID) | ORAL | Status: DC

## 2016-01-06 MED ORDER — POTASSIUM CHLORIDE CRYS ER 20 MEQ PO TBCR
20.0000 meq | EXTENDED_RELEASE_TABLET | Freq: Every day | ORAL | Status: DC
  Administered 2016-01-06: 20 meq via ORAL
  Filled 2016-01-06: qty 1

## 2016-01-06 MED ORDER — METOPROLOL TARTRATE 25 MG PO TABS
25.0000 mg | ORAL_TABLET | Freq: Two times a day (BID) | ORAL | Status: DC
  Administered 2016-01-06 – 2016-01-07 (×3): 25 mg via ORAL
  Filled 2016-01-06 (×3): qty 1

## 2016-01-06 MED ORDER — HYDROCHLOROTHIAZIDE 25 MG PO TABS: 25.0000 mg | ORAL_TABLET | Freq: Every day | ORAL | Status: DC

## 2016-01-06 MED ORDER — STROKE: EARLY STAGES OF RECOVERY BOOK
Freq: Once | Status: DC
  Filled 2016-01-06: qty 1

## 2016-01-06 MED ORDER — INSULIN ASPART 100 UNIT/ML ~~LOC~~ SOLN: 0.0000 [IU] | Freq: Three times a day (TID) | SUBCUTANEOUS | Status: DC

## 2016-01-06 MED ORDER — LOSARTAN POTASSIUM-HCTZ 100-25 MG PO TABS: 1.0000 | ORAL_TABLET | Freq: Every day | ORAL | Status: DC

## 2016-01-06 MED ORDER — SODIUM CHLORIDE 0.9 % IV BOLUS (SEPSIS)
1000.0000 mL | Freq: Once | INTRAVENOUS | Status: AC
  Administered 2016-01-06: 1000 mL via INTRAVENOUS

## 2016-01-06 MED ORDER — INSULIN ASPART 100 UNIT/ML ~~LOC~~ SOLN: 0.0000 [IU] | Freq: Every day | SUBCUTANEOUS | Status: DC

## 2016-01-06 NOTE — Progress Notes (Signed)
Patient admitted from ED in room 5 MW 17. Patient alert and oriented x 4. Tele placed and was verified. Patient oriented to room and was made comfortable. Will continue to monitor.

## 2016-01-06 NOTE — ED Notes (Signed)
Pt refused medications, stating that she thinks she got toradol yesterday and it made her sick.  Explained use of reglan in conjunction with toradol  Pt sts she wants to wait and think about it.

## 2016-01-06 NOTE — Consult Note (Signed)
NEURO HOSPITALIST CONSULT NOTE   Requestig physician: Dr. Mora Bellmanni  Reason for Consult: Possible TIA  History obtained from:  Patient     HPI:                                                                                                                                          Anna Lester is an [age] y.o. female who awoke at about 10 AM Wednesday with stroke like symptoms consisting of word-finding difficulty and slurred speech. Did feel weak "all over" and also had right lower extremity weakness with gait instability and one fall prior to admission. Also had a headache, which has persisted. She had nausea followed by vomiting this morning. She complains of a general feeling of malaise as well. Her speech and strength are now back to baseline.   Her PMHx includes DM, HTN, IBS, lupus and thyroid disease. Takes ASA and atorvastatin at home.   Past Medical History:  Diagnosis Date  . Chest pain   . Diabetes mellitus, type 2 (HCC)   . Essential hypertension   . Irritable bowel syndrome 2017  . Lupus   . Palpitations   . S/P radiofrequency ablation operation for arrhythmia   . SLE (systemic lupus erythematosus) (HCC)   . SVT (supraventricular tachycardia) (HCC)   . Thyroid disease    enlarged thyroid, followed by PCP    Past Surgical History:  Procedure Laterality Date  . ABDOMINAL HYSTERECTOMY    . APPENDECTOMY    . ESOPHAGEAL DILATION    . HERNIA REPAIR      Family History  Problem Relation Age of Onset  . Colon cancer Mother   . Leukemia Sister    Social History:  reports that she has quit smoking. Her smoking use included Cigarettes. She has never used smokeless tobacco. She reports that she does not drink alcohol or use drugs.  Allergies  Allergen Reactions  . Ace Inhibitors Other (See Comments)    cough  . Amlodipine Swelling  . Codeine Nausea Only  . Isosorbide Nitrate Other (See Comments)    dizziness  . Metformin And Related Diarrhea  .  Tramadol Nausea Only  . Nexium [Esomeprazole] Rash    MEDICATIONS:  Current Meds  Medication Sig  . ammonium lactate (LAC-HYDRIN) 12 % lotion Apply 1 application topically 2 (two) times daily as needed for dry skin. Apply to both feet Indications: Abnormal Dryness of Skin, Eyes or Mucous Membranes  . aspirin 81 MG EC tablet Take 81 mg by mouth daily.   Marland Kitchen. atorvastatin (LIPITOR) 40 MG tablet Take 40 mg by mouth daily at 6 PM.   . chlorthalidone (HYGROTON) 25 MG tablet Take 25 mg by mouth daily.  Marland Kitchen. HYDROcodone-acetaminophen (NORCO/VICODIN) 5-325 MG per tablet Take 1 tablet by mouth every 6 (six) hours as needed for moderate pain or severe pain.  Marland Kitchen. insulin glargine (LANTUS) 100 unit/mL SOPN Inject 30 Units into the skin at bedtime.  Marland Kitchen. losartan-hydrochlorothiazide (HYZAAR) 100-25 MG per tablet Take 1 tablet by mouth daily.   . metoprolol (LOPRESSOR) 100 MG tablet Take 1 tablet (100 mg total) by mouth 2 (two) times daily.  Marland Kitchen. omeprazole (PRILOSEC) 40 MG capsule Take 40 mg by mouth daily.   . ondansetron (ZOFRAN ODT) 4 MG disintegrating tablet Take 1 tablet (4 mg total) by mouth every 8 (eight) hours as needed for nausea or vomiting.  . potassium chloride SA (K-DUR,KLOR-CON) 20 MEQ tablet Take 20 mEq by mouth daily.  . predniSONE (DELTASONE) 10 MG tablet Take 10 mg by mouth daily with breakfast.  . Travoprost, BAK Free, (TRAVATAN Z) 0.004 % SOLN ophthalmic solution Place 1 drop into both eyes at bedtime.    ROS:                                                                                                                                       History obtained from patient. Continues to have a mild headache. Also endorses chills, but no dysuria, diarrhea or cough. Has right sided back pain, which radiates to her right abdomen.   Blood pressure 150/72, pulse 88, resp. rate 25, SpO2 98  %.   General Examination:                                                                                                      HEENT-  Normocephalic/atraumatic. Lungs- Respirations unlabored. No gross wheezes.  Extremities- Warm and well-perfused.   Neurological Examination Mental Status: Alert, oriented, thought content appropriate.  Speech fluent, with intact naming, comprehension and repetition  Able to follow all commands without difficulty. Cranial Nerves: II: Visual fields intact to confrontation, PERRL III,IV, VI: ptosis not present, EOMI without nystagmus V,VII: smile symmetric, facial temperature  sensation normal bilaterally VIII: hearing intact to conversation IX,X: palate rises symmetrically XI: symmetric XII: midline tongue extension Motor: Right : Upper extremity   5/5    Left:     Upper extremity   5/5  Lower extremity   5/5     Lower extremity   5/5 Normal tone throughout; no atrophy noted Sensory: Decreased temperature sensation RUE and RLE. Fine touch intact, without extinction Deep Tendon Reflexes: 1+ ankles, 0 at patellae, 2+ biceps and brachioradialis.Toes downgoing bilaterally.  Cerebellar: Dysmetria with right FNF. Dystaxic RAM to RUE. Normal RAM and FNF on left.  Gait: Deferred.   Lab Results: Basic Metabolic Panel:  Recent Labs Lab 01/06/16 0010 01/06/16 0025  NA 141 140  K 3.3* 3.2*  CL 106 106  CO2 23  --   GLUCOSE 88 89  BUN 30* 30*  CREATININE 2.01* 2.00*  CALCIUM 9.8  --     Liver Function Tests:  Recent Labs Lab 01/06/16 0010  AST 32  ALT 24  ALKPHOS 107  BILITOT 0.6  PROT 6.9  ALBUMIN 3.8   No results for input(s): LIPASE, AMYLASE in the last 168 hours. No results for input(s): AMMONIA in the last 168 hours.  CBC:  Recent Labs Lab 01/06/16 0010 01/06/16 0025  WBC 11.8*  --   NEUTROABS 8.5*  --   HGB 11.8* 12.2  HCT 36.5 36.0  MCV 82.4  --   PLT 193  --     Cardiac Enzymes: No results for input(s): CKTOTAL, CKMB,  CKMBINDEX, TROPONINI in the last 168 hours.  Lipid Panel: No results for input(s): CHOL, TRIG, HDL, CHOLHDL, VLDL, LDLCALC in the last 168 hours.  CBG: No results for input(s): GLUCAP in the last 168 hours.  Microbiology: No results found for this or any previous visit.  Coagulation Studies:  Recent Labs  01/06/16 0010  LABPROT 12.7  INR 0.96    Imaging: Dg Chest 2 View  Result Date: 01/05/2016 CLINICAL DATA:  Right-sided chest pain, emesis and jaw pain, onset today. EXAM: CHEST  2 VIEW COMPARISON:  07/07/2014 FINDINGS: The heart size and mediastinal contours are within normal limits. Both lungs are clear. The visualized skeletal structures are unremarkable. IMPRESSION: No active cardiopulmonary disease. Electronically Signed   By: Ellery Plunk M.D.   On: 01/05/2016 22:02   Ct Head Wo Contrast  Result Date: 01/05/2016 CLINICAL DATA:  Weakness, word-finding difficulties, headache and vomiting beginning this morning. History of lupus, hypertension, diabetes. EXAM: CT HEAD WITHOUT CONTRAST TECHNIQUE: Contiguous axial images were obtained from the base of the skull through the vertex without intravenous contrast. COMPARISON:  MRI head Jun 17, 2015 FINDINGS: BRAIN: The ventricles and sulci are normal for age. No intraparenchymal hemorrhage, mass effect nor midline shift. Patchy supratentorial white matter hypodensities within normal range for patient's age, though non-specific are most compatible with chronic small vessel ischemic disease. No acute large vascular territory infarcts. No abnormal extra-axial fluid collections. Basal cisterns are patent. VASCULAR: Mild calcific atherosclerosis of the carotid siphons. SKULL: No skull fracture. No significant scalp soft tissue swelling. SINUSES/ORBITS: The mastoid air-cells and included paranasal sinuses are well-aerated.The included ocular globes and orbital contents are non-suspicious. OTHER: Similar heterogeneous parotid glands, which may be  is secondary to patient's autoimmune disorder. IMPRESSION: No acute intracranial process.  Negative CT HEAD for age. Electronically Signed   By: Awilda Metro M.D.   On: 01/05/2016 22:47    Assessment: 1. Acute onset of dysarthria, dysphasia and right lower extremity  weakness. Symptoms now resolved. Localizable as possible TIA involving the left frontal and/or temporal lobes.  2. Has RUE findings on exam suggestive of possible right cerebellar stroke.  3. Absent patellar reflexes. DDx includes bilateral L3 radiculopathy and hypothyroidism.  4. Elevated creatinine of 2. Baseline of 0.71 six months ago. Suggestive of AKI and may explain her generalized malaise.  5. Right sided back pain radiating to right abdomen. DDx includes radiculopathy, nephrolithiasis and symptoms of her IBS.   Recommendations: 1. MRI brain, MRA head, carotid ultrasound, TTE.  2. Continue ASA and atorvastatin. May need to escalate antiplatelet therapy if MRI is positive for subacute stroke. 3. PT/OT/Speech.  4. BP management. Given other comorbidities, benefits of permissive HTN most likely outweighed by risks.  5. Evaluation for underlying etiology of elevated Cr.  6. TSH level.  7. MRI of lumbar spine as outpatient if right back and abdomen pain is determined not to be due to a visceral etiology.   Electronically signed: Dr. Caryl Pina 01/06/2016, 1:57 AM

## 2016-01-06 NOTE — ED Notes (Signed)
Pt here for slurred speech and aphagia. Pt a&ox4. [facility] NIH was 5. Pt passed swallow screen. VSS.

## 2016-01-06 NOTE — ED Notes (Signed)
Pt returned from MRI. Pt resting in bed with call bell within reach.

## 2016-01-06 NOTE — Progress Notes (Signed)
PT Cancellation Note  Patient Details Name: Anna Lester MRN: [id number] DOB: 30-Nov-1950   Cancelled Treatment:    Reason Eval/Treat Not Completed: Patient at procedure or test/unavailable (Pt in MRI.  Will check back as able.  Thanks. )   Amadeo GarnetDawn F Kaliyan Osbourn 01/06/2016, 11:02 AM

## 2016-01-06 NOTE — Evaluation (Signed)
Occupational Therapy Evaluation Patient Details Name: Anna SimasLillian Lester MRN: [id number] DOB: Jun 20, 1950 Today's Date: 01/06/2016    History of Present Illness Anna Lester is a 65 y.o. female with medical history significant for diabetes, hypertension, irritable bowel syndrome, lupus on chronic steroids and sciatica who began experiencing dysarthria and slurred speech with bilateral facial numbness. MRI on 12/7 negative for acute infarct.    Clinical Impression   Pt reports she was independent with ADL PTA and used a rollator for mobility. Currently pt requires min assist for functional mobility and LB ADL, supervision for seated UB ADL. Pt presenting with generalized weakness in bil UEs, decreased sensation in RUE, poor standing balance, and noted to be dragging RLE during mobility. Pt planning to d/c home with intermittent family supervision. Recommending HHOT for follow up to maximize independence and safety with ADL and functional mobility upon return home. Pt would benefit from continued skilled OT to address established goals.    Follow Up Recommendations  Home health OT;Supervision/Assistance - 24 hour    Equipment Recommendations  3 in 1 bedside commode    Recommendations for Other Services PT consult     Precautions / Restrictions Precautions Precautions: Fall Restrictions Weight Bearing Restrictions: No      Mobility Bed Mobility Overal bed mobility: Needs Assistance Bed Mobility: Supine to Sit;Sit to Supine     Supine to sit: Min guard Sit to supine: Min guard   General bed mobility comments: Min guard for trunk elevation to sitting and for RLE return to bed.  Transfers Overall transfer level: Needs assistance Equipment used: Rolling walker (2 wheeled) Transfers: Sit to/from Stand Sit to Stand: Min assist         General transfer comment: Min assist to boost up. Cues for hand placement.    Balance Overall balance assessment: Needs  assistance Sitting-balance support: Feet supported;No upper extremity supported Sitting balance-Leahy Scale: Good     Standing balance support: Bilateral upper extremity supported Standing balance-Leahy Scale: Poor Standing balance comment: RW for UE support                            ADL Overall ADL's : Needs assistance/impaired     Grooming: Set up;Supervision/safety;Sitting   Upper Body Bathing: Set up;Supervision/ safety;Sitting   Lower Body Bathing: Minimal assistance;Sit to/from stand Lower Body Bathing Details (indicate cue type and reason): assist for sit to stand Upper Body Dressing : Set up;Supervision/safety;Sitting   Lower Body Dressing: Minimal assistance;Sit to/from stand Lower Body Dressing Details (indicate cue type and reason): assist for sit to stand. Pt able to don/doff socks sitting EOB Toilet Transfer: Minimal assistance;Ambulation;Regular Toilet;RW   Toileting- Clothing Manipulation and Hygiene: Supervision/safety;Sitting/lateral lean Toileting - Clothing Manipulation Details (indicate cue type and reason): for peri care only     Functional mobility during ADLs: Minimal assistance;Rolling walker General ADL Comments: Pt dragging R LE during functional mobility and pushing RW too far infront of her despite max cues. Pt with LOB x1 requiring mod assist to correct. Pt c/o dizziness with functional mobility in room.     Vision Additional Comments: Appears WFL. Needs further assessment.   Perception     Praxis      Pertinent Vitals/Pain Pain Assessment: 0-10 Pain Score: 7  Pain Location: back and RLE Pain Descriptors / Indicators: Aching;Grimacing;Guarding Pain Intervention(s): Monitored during session;Limited activity within patient's tolerance     Hand Dominance Right   Extremity/Trunk Assessment Upper Extremity Assessment Upper  Extremity Assessment: Generalized weakness;RUE deficits/detail RUE Deficits / Details: pt reports impaired  sensation on R side RUE Sensation: decreased light touch RUE Coordination: decreased fine motor   Lower Extremity Assessment Lower Extremity Assessment: Defer to PT evaluation   Cervical / Trunk Assessment Cervical / Trunk Assessment: Other exceptions Cervical / Trunk Exceptions: body habitus   Communication Communication Communication: No difficulties   Cognition Arousal/Alertness: Awake/alert Behavior During Therapy: WFL for tasks assessed/performed Overall Cognitive Status: Within Functional Limits for tasks assessed                     General Comments       Exercises       Shoulder Instructions      Home Living Family/patient expects to be discharged to:: Private residence Living Arrangements: Other relatives (granddaughter) Available Help at Discharge: Family;Available PRN/intermittently (granddaughter in school during the day) Type of Home: Apartment Home Access: Level entry     Home Layout: One level     Bathroom Shower/Tub: Chief Strategy OfficerTub/shower unit   Bathroom Toilet: Standard     Home Equipment: Environmental consultantWalker - 4 wheels          Prior Functioning/Environment Level of Independence: Independent with assistive device(s)        Comments: Uses rollator for all mobility. Independent with ADL, does not get in bath tub-only sponge bathes, drives, does cooking and cleaning        OT Problem List: Decreased strength;Impaired balance (sitting and/or standing);Decreased coordination;Decreased knowledge of use of DME or AE;Obesity;Impaired UE functional use;Pain   OT Treatment/Interventions: Self-care/ADL training;Therapeutic exercise;Energy conservation;DME and/or AE instruction;Therapeutic activities;Patient/family education;Balance training    OT Goals(Current goals can be found in the care plan section) Acute Rehab OT Goals Patient Stated Goal: return to being independent OT Goal Formulation: With patient Time For Goal Achievement: 01/20/16 Potential to  Achieve Goals: Good ADL Goals Pt Will Perform Grooming: with supervision;standing Pt Will Transfer to Toilet: with supervision;ambulating;bedside commode (over toilet) Pt Will Perform Toileting - Clothing Manipulation and hygiene: with supervision;sit to/from stand Pt Will Perform Tub/Shower Transfer: Tub transfer;with supervision;ambulating;3 in 1;rolling walker Pt/caregiver will Perform Home Exercise Program: Increased strength;Both right and left upper extremity;Independently;With written HEP provided (increase fine motor coordination)  OT Frequency: Min 2X/week   Barriers to D/C: Decreased caregiver support  alone during the day       Co-evaluation              End of Session Equipment Utilized During Treatment: Gait belt;Rolling walker Nurse Communication: Mobility status  Activity Tolerance: Patient tolerated treatment well Patient left: in bed;with call bell/phone within reach;with bed alarm set   Time: [id number] OT Time Calculation (min): 25 min Charges:  OT General Charges $OT Visit: 1 Procedure OT Evaluation $OT Eval Moderate Complexity: 1 Procedure OT Treatments $Self Care/Home Management : 8-22 mins G-Codes: OT G-codes **NOT FOR INPATIENT CLASS** Functional Assessment Tool Used: Clinical judgement Functional Limitation: Self care Self Care Current Status ([id number]): At least 1 percent but less than 20 percent impaired, limited or restricted Self Care Goal Status (N3614(G8988): At least 1 percent but less than 20 percent impaired, limited or restricted    Gaye AlkenBailey A Abdulkarim Eberlin M.S., OTR/L Pager: [phone]  01/06/2016, 4:21 PM

## 2016-01-06 NOTE — ED Notes (Signed)
Spoke to MRI will give ativan when they are have a scanner for pt.

## 2016-01-06 NOTE — ED Provider Notes (Signed)
I was singed out patient as Anna Lester with neg CT, but pending labs and UA.  Patient will need admission for further work up. I spoke to her as well and she states she had slurred speech with difficulty word finding.  In addition she had some weakness in BLE.  Labs reveal AKI with Cr of 2.0.  I think patient will benefit from Anna Lester work up in the hospital given her age and risk factors.  She was ordered IVF.  Will page hospitalist for admission.   Tomasita CrumbleAdeleke Chrislyn Seedorf, MD 01/06/16 [phone]

## 2016-01-06 NOTE — ED Notes (Signed)
Pt was unable to tolerate MRI, brought back. Callie RN paged Toniann FailKakrakandy MD to inform.

## 2016-01-06 NOTE — ED Notes (Signed)
Patient transported to MRI 

## 2016-01-06 NOTE — Progress Notes (Signed)
STROKE TEAM PROGRESS NOTE   HISTORY OF PRESENT ILLNESS (per record) Anna Lester is an 65 y.o. female who awoke at about 10 AM Wednesday 01/05/2016 with stroke like symptoms consisting of word-finding difficulty and slurred speech. Did feel weak "all over" and also had right lower extremity weakness with gait instability and one fall prior to admission. Also had a headache, which has persisted. She had nausea followed by vomiting this morning. She complains of a general feeling of malaise as well. Her speech and strength are now back to baseline.   Her PMHx includes DM, HTN, IBS, lupus and thyroid disease. Takes ASA and atorvastatin at home.   Patient was not administered IV t-PA secondary to delay in arrival, resolved. She was admitted for further evaluation and treatment.   SUBJECTIVE (INTERVAL HISTORY) No family at bedside. She stated that she had SVT s/p ablation this year. She had loop recorder for 3 years and removed in 10/2015. After ablation, no arrhythmia seen, however, she still feels palpitation frequently but loop recorder did not find any arrhythmia. She follows with cardiology Dr. Larita Fife in [facility].   She also has Lupus and diagnosed in 2009 in [place] with joint pain and swelling, on prednisone and not working.   She stated that yesterday morning, she had HA and N/V, felt tired. In the evening, she had slurry speech, word not coming out right, imbalance, and fell. She was sent to ED and symptoms gradually getting better but not baseline yet. Now she still feels nauseous, and right sided numbness and HA.    OBJECTIVE Pulse Rate:  [72-98] 74 (12/07 1300) Resp:  [8-32] 32 (12/07 1300) BP: (103-184)/(57-94) 108/63 (12/07 1300) SpO2:  [94 %-100 %] 94 % (12/07 1300)  CBC:  Recent Labs Lab 01/06/16 0010 01/06/16 0025  WBC 11.8*  --   NEUTROABS 8.5*  --   HGB 11.8* 12.2  HCT 36.5 36.0  MCV 82.4  --   PLT 193  --     Basic Metabolic Panel:  Recent Labs Lab 01/06/16 0010  01/06/16 0025  NA 141 140  K 3.3* 3.2*  CL 106 106  CO2 23  --   GLUCOSE 88 89  BUN 30* 30*  CREATININE 2.01* 2.00*  CALCIUM 9.8  --   MG 1.9  --     Lipid Panel: No results found for: CHOL, TRIG, HDL, CHOLHDL, VLDL, LDLCALC HgbA1c:  Lab Results  Component Value Date   HGBA1C 7.2 (H) 04/26/2014   Urine Drug Screen:    Component Value Date/Time   LABOPIA NONE DETECTED 01/05/2016 2316   COCAINSCRNUR NONE DETECTED 01/05/2016 2316   LABBENZ NONE DETECTED 01/05/2016 2316   AMPHETMU NONE DETECTED 01/05/2016 2316   THCU NONE DETECTED 01/05/2016 2316   LABBARB NONE DETECTED 01/05/2016 2316      IMAGING I have personally reviewed the radiological images below and agree with the radiology interpretations.  Dg Chest 2 View 01/05/2016 No active cardiopulmonary disease.   Ct Head Wo Contrast 01/05/2016 No acute intracranial process.  Negative CT HEAD for age.   Mr Maxine Glenn Head Wo Contrast 01/06/2016 No proximal large vessel occlusion or flow-limiting stenosis of the intracranial circulation.   Mr Brain Wo Contrast 01/06/2016 No acute stroke.  Mild atrophy and small vessel disease.   CUS pending  TTE pending   PHYSICAL EXAM  Temp:  [98.5 F (36.9 C)] 98.5 F (36.9 C) (12/07 1600) Pulse Rate:  [72-98] 86 (12/07 1600) Resp:  [8-32] 20 (12/07 1600)  BP: (103-184)/(57-94) 138/79 (12/07 1600) SpO2:  [94 %-100 %] 98 % (12/07 1600)  General - Well nourished, well developed, in no apparent distress.  Ophthalmologic - Fundi not visualized due to eye movement.  Cardiovascular - Regular rate and rhythm.  Mental Status -  Level of arousal and orientation to time, place, and person were intact. Language including expression, naming, repetition, comprehension was assessed and found intact. Fund of Knowledge was assessed and was intact.  Cranial Nerves II - XII - II - Visual field intact OU. III, IV, VI - Extraocular movements intact. V - Facial sensation subjectively  decreased on the right, 50% of left, but tuning fork test also has midline split. VII - Facial movement intact bilaterally. VIII - Hearing & vestibular intact bilaterally. X - Palate elevates symmetrically. XI - Chin turning & shoulder shrug intact bilaterally. XII - Tongue protrusion intact.  Motor Strength - The patient's strength exam lack of effort, grossly symmetric bilaterally and 3/5 throughout. Bulk was normal and fasciculations were absent.   Motor Tone - Muscle tone was assessed at the neck and appendages and was normal.  Reflexes - The patient's reflexes were 1+ in all extremities and she had no pathological reflexes.  Sensory - Light touch, temperature/pinprick were assessed and were subjectively decreased on the right side, 50% of the left.    Coordination - The patient had normal movements in the hands with no ataxia or dysmetria, but slow in action.  Tremor was absent.  Gait and Station - not tested   ASSESSMENT/PLAN Anna Lester is a 65 y.o. female with history of diabetes, hypertension, irritable bowel, lupus, thyroid disease, chronic steroid use and sciatica presenting with word finding difficulty, slurred speech, right lower extremity weakness and gait instability with fall at home prior to admission. She did not receive IV t-PA due to delay in arrival, symptoms resolved.   Complicated migraine vs. Conversion disorder  MRI  no acute stroke  MRA  no significant flow limiting stenosis  Carotid Doppler  pending  2D Echo  pending  LDL pending  HgbA1c pending  Hypercoagulable panel pending due to ? hx of lupus (ANA, anti-DNA, double-stranded, beta 2 glycoprotein, CRP, cardiolipin antibody, homocystine, lupus anticoagulant, ESR)  Lovenox 30 mg sq daily for VTE prophylaxis  Diet heart healthy/carb modified Room service appropriate? Yes; Fluid consistency: Thin  aspirin 81 mg daily prior to admission, now on aspirin 325 mg daily.   Patient counseled to be  compliant with her antithrombotic medications  Ongoing aggressive stroke risk factor management  Therapy recommendations:  Pending  Disposition:  Anticipate return home  ? Lupus  Diagnosed in 2009 in [place] as per pt  On low dose daily prednisone  Hypercoagulable work up to rule out antiphospholipid syndrome  Repeat some autoimmune markers  SVT s/p ablation  Still complain intermittent palpitation after ablation  But loop recorder did not show arrhythmia as per pt  Loop recorder remove in 10/2015  Tele showed sinus rhythm  Hypertension  Stable Permissive hypertension (OK if < 220/120) but gradually normalize in 5-7 days Long-term BP goal normotensive  Hyperlipidemia  Home meds:  Lipitor 40 mg daily, resumed in hospital  LDL pending, goal < 70  Continue statin at discharge  Diabetes  HgbA1c pending, goal < 7.0  SSI  On lantus  Other Stroke Risk Factors  Advanced age  Former Cigarette smoker  Other [facility] day # 0  Rosalin Hawking, MD PhD Stroke Neurology 01/06/2016  6:01 PM    To contact Stroke Continuity provider, please refer to [email or web address] After hours, contact General Neurology

## 2016-01-06 NOTE — ED Notes (Signed)
Pt refused MRI, admitting MD aware.

## 2016-01-06 NOTE — ED Notes (Signed)
Heart Healthy Diet ordered for patient.

## 2016-01-06 NOTE — H&P (Signed)
History and Physical    Anna SimasLillian Pichon ZOX:[id number] DOB: December 03, 1950 DOA: 01/05/2016   PCP: Rocky MorelOSTAND, ROBERT, MD   Patient coming from/Resides with: Private residence/lives with granddaughter  Admission status: Observation/telemetry -it may be medically necessary to stay a minimum 2 midnights to rule out impending and/or unexpected changes in physiologic status that may differ from initial evaluation performed in the ER and/or at time of admission therefore please consider reevaluating admission status within the next 24 hours.   Chief Complaint: Dysarthria and slurred speech  HPI: Anna Lester is a 65 y.o. female with medical history significant for diabetes, hypertension, irritable bowel syndrome, lupus on chronic steroids and sciatica who began experiencing dysarthria and slurred speech with bilateral facial numbness around 10:00 am on 12/6. Symptoms persisted for multiple hours until she presented to the ER around 9 PM on the same date. Prior to arrival she developed a headache with subsequent development of nausea and vomiting and feeling of generalized weakness. She did not have any focal motor deficits/weakness and denies dizziness or vertigo type symptoms. She has not had any documented fevers although she reported chills. She denies dysuria or diarrhea. She states on Tuesday she went to her primary care physician and was given a shot of Toradol for her sciatica.  ED Course:  Vital Signs: BP 103/64   Pulse 74   Resp 14   SpO2 98%   2 two-view chest x-ray: No active disease CT head without contrast: No acute intracranial process Lab data: Sodium 141, potassium 3.3, BUN 30, creatinine 2.01, LFTs normal, poc troponin 0.00, WBCs 11,800 with neutrophils 73% and absolute neutrophils 8.5%, coags normal, hemoglobin 11.8, platelets 193,000, urinalysis unremarkable except for urine specific gravity less than 1.005, alcohol level less than 5, urine drug screen negative Medications and  treatments: Toradol 30 mg IV 1, Reglan 10 mg IV 1, Benadryl 50 mg IV 1, Tylenol thousand milligrams 1, normal saline bolus 1000 mL 1, Ativan 1 mg IV 1  Review of Systems:  In addition to the HPI above,  No Fever, myalgias or other constitutional symptoms No Headache, changes with Vision or hearing, new focal weakness, tingling, numbness in any extremity, dizziness, gait disturbance or imbalance, tremors or seizure activity No problems swallowing food or Liquids, indigestion/reflux, choking or coughing while eating, abdominal pain with or after eating No Chest pain, Cough or Shortness of Breath, palpitations, orthopnea or DOE No Abdominal pain, melena,hematochezia, dark tarry stools, constipation No dysuria, malodorous urine, hematuria or flank pain No new skin rashes, lesions, masses or bruises, No new joint pains, aches, swelling or redness No recent unintentional weight gain or loss No polyuria, polydypsia or polyphagia   Past Medical History:  Diagnosis Date  . Chest pain   . Diabetes mellitus, type 2 (HCC)   . Essential hypertension   . Irritable bowel syndrome 2017  . Lupus   . Palpitations   . S/P radiofrequency ablation operation for arrhythmia   . SLE (systemic lupus erythematosus) (HCC)   . SVT (supraventricular tachycardia) (HCC)   . Thyroid disease    enlarged thyroid, followed by PCP    Past Surgical History:  Procedure Laterality Date  . ABDOMINAL HYSTERECTOMY    . APPENDECTOMY    . ESOPHAGEAL DILATION    . HERNIA REPAIR      Social History   Social History  . Marital status: Widowed    Spouse name: N/A  . Number of children: N/A  . Years of education: N/A   Occupational History  .  Not on file.   Social History Main Topics  . Smoking status: Former Smoker    Types: Cigarettes  . Smokeless tobacco: Never Used  . Alcohol use No  . Drug use: No  . Sexual activity: Not Currently    Birth control/ protection: None, Surgical   Other Topics  Concern  . Not on file   Social History Narrative  . No narrative on file    Mobility: Rolling walker Work history: Not obtained   Allergies  Allergen Reactions  . Ace Inhibitors Other (See Comments)    cough  . Amlodipine Swelling  . Codeine Nausea Only  . Isosorbide Nitrate Other (See Comments)    dizziness  . Metformin And Related Diarrhea  . Tramadol Nausea Only  . Nexium [Esomeprazole] Rash    Family History  Problem Relation Age of Onset  . Colon cancer Mother   . Leukemia Sister     Prior to Admission medications   Medication Sig Start Date End Date Taking? Authorizing Provider  ammonium lactate (LAC-HYDRIN) 12 % lotion Apply 1 application topically 2 (two) times daily as needed for dry skin. Apply to both feet Indications: Abnormal Dryness of Skin, Eyes or Mucous Membranes 03/04/13  Yes Historical Provider, MD  aspirin 81 MG EC tablet Take 81 mg by mouth daily.  11/20/12  Yes Historical Provider, MD  atorvastatin (LIPITOR) 40 MG tablet Take 40 mg by mouth daily at 6 PM.  02/13/13  Yes Historical Provider, MD  chlorthalidone (HYGROTON) 25 MG tablet Take 25 mg by mouth daily.   Yes Historical Provider, MD  HYDROcodone-acetaminophen (NORCO/VICODIN) 5-325 MG per tablet Take 1 tablet by mouth every 6 (six) hours as needed for moderate pain or severe pain. 10/11/14  Yes Lavera Guise, MD  insulin glargine (LANTUS) 100 unit/mL SOPN Inject 30 Units into the skin at bedtime.   Yes Historical Provider, MD  losartan-hydrochlorothiazide (HYZAAR) 100-25 MG per tablet Take 1 tablet by mouth daily.  05/21/13  Yes Historical Provider, MD  metoprolol (LOPRESSOR) 100 MG tablet Take 1 tablet (100 mg total) by mouth 2 (two) times daily. 04/27/14  Yes Zannie Cove, MD  omeprazole (PRILOSEC) 40 MG capsule Take 40 mg by mouth daily.  03/28/13  Yes Historical Provider, MD  ondansetron (ZOFRAN ODT) 4 MG disintegrating tablet Take 1 tablet (4 mg total) by mouth every 8 (eight) hours as needed for  nausea or vomiting. 06/25/14  Yes Everlene Farrier, PA-C  potassium chloride SA (K-DUR,KLOR-CON) 20 MEQ tablet Take 20 mEq by mouth daily.   Yes Historical Provider, MD  predniSONE (DELTASONE) 10 MG tablet Take 10 mg by mouth daily with breakfast.   Yes Historical Provider, MD  Travoprost, BAK Free, (TRAVATAN Z) 0.004 % SOLN ophthalmic solution Place 1 drop into both eyes at bedtime.   Yes Historical Provider, MD    Physical Exam: Vitals:   01/06/16 0400 01/06/16 0500 01/06/16 0600 01/06/16 0700  BP: 112/58 104/68 114/64 103/64  Pulse: 87 80 72 74  Resp: (!) 27 16 15 14   SpO2: 94% 99% 99% 98%      Constitutional: NAD, calm, comfortable Eyes: PERRL, lids and conjunctivae normal ENMT: Mucous membranes are moist. Posterior pharynx clear of any exudate or lesions.Normal dentition.  Neck: normal, supple, no masses, no thyromegaly Respiratory: clear to auscultation bilaterally, no wheezing, no crackles. Normal respiratory effort. No accessory muscle use.  Cardiovascular: Regular rate and rhythm, no murmurs / rubs / gallops. No extremity edema. 2+ pedal pulses. No carotid  bruits.  Abdomen: no tenderness, no masses palpated. No hepatosplenomegaly. Bowel sounds positive.  Musculoskeletal: no clubbing / cyanosis. No joint deformity upper and lower extremities. Good ROM, no contractures. Normal muscle tone.  Skin: no rashes, lesions, ulcers. No induration Neurologic: CN 2-12 grossly intact-mildly lateral disconjugate gaze bilaterally . Sensation intact, DTR normal. Strength 5/5 x all 4 extremities-Although lifting of left leg limited by pain in setting of known history of sciatica. Some difficulty performing repeated finger to nose technique. Psychiatric: Normal judgment and insight. Alert and oriented x 3. Normal mood.    Labs on Admission: I have personally reviewed following labs and imaging studies  CBC:  Recent Labs Lab 01/06/16 0010 01/06/16 0025  WBC 11.8*  --   NEUTROABS 8.5*  --     HGB 11.8* 12.2  HCT 36.5 36.0  MCV 82.4  --   PLT 193  --    Basic Metabolic Panel:  Recent Labs Lab 01/06/16 0010 01/06/16 0025  NA 141 140  K 3.3* 3.2*  CL 106 106  CO2 23  --   GLUCOSE 88 89  BUN 30* 30*  CREATININE 2.01* 2.00*  CALCIUM 9.8  --    GFR: CrCl cannot be calculated (Unknown ideal weight.). Liver Function Tests:  Recent Labs Lab 01/06/16 0010  AST 32  ALT 24  ALKPHOS 107  BILITOT 0.6  PROT 6.9  ALBUMIN 3.8   No results for input(s): LIPASE, AMYLASE in the last 168 hours. No results for input(s): AMMONIA in the last 168 hours. Coagulation Profile:  Recent Labs Lab 01/06/16 0010  INR 0.96   Cardiac Enzymes: No results for input(s): CKTOTAL, CKMB, CKMBINDEX, TROPONINI in the last 168 hours. BNP (last 3 results) No results for input(s): PROBNP in the last 8760 hours. HbA1C: No results for input(s): HGBA1C in the last 72 hours. CBG: No results for input(s): GLUCAP in the last 168 hours. Lipid Profile: No results for input(s): CHOL, HDL, LDLCALC, TRIG, CHOLHDL, LDLDIRECT in the last 72 hours. Thyroid Function Tests: No results for input(s): TSH, T4TOTAL, FREET4, T3FREE, THYROIDAB in the last 72 hours. Anemia Panel: No results for input(s): VITAMINB12, FOLATE, FERRITIN, TIBC, IRON, RETICCTPCT in the last 72 hours. Urine analysis:    Component Value Date/Time   COLORURINE YELLOW 01/05/2016 2316   APPEARANCEUR CLEAR 01/05/2016 2316   LABSPEC <1.005 (L) 01/05/2016 2316   PHURINE 5.5 01/05/2016 2316   GLUCOSEU NEGATIVE 01/05/2016 2316   HGBUR NEGATIVE 01/05/2016 2316   BILIRUBINUR NEGATIVE 01/05/2016 2316   KETONESUR NEGATIVE 01/05/2016 2316   PROTEINUR NEGATIVE 01/05/2016 2316   UROBILINOGEN 0.2 10/11/2014 1030   NITRITE NEGATIVE 01/05/2016 2316   LEUKOCYTESUR TRACE (A) 01/05/2016 2316   Sepsis Labs: @LABRCNTIP (procalcitonin:4,lacticidven:4) )No results found for this or any previous visit (from the past 240 hour(s)).   Radiological  Exams on Admission: Dg Chest 2 View  Result Date: 01/05/2016 CLINICAL DATA:  Right-sided chest pain, emesis and jaw pain, onset today. EXAM: CHEST  2 VIEW COMPARISON:  07/07/2014 FINDINGS: The heart size and mediastinal contours are within normal limits. Both lungs are clear. The visualized skeletal structures are unremarkable. IMPRESSION: No active cardiopulmonary disease. Electronically Signed   By: Ellery Plunkaniel R Mitchell M.D.   On: 01/05/2016 22:02   Ct Head Wo Contrast  Result Date: 01/05/2016 CLINICAL DATA:  Weakness, word-finding difficulties, headache and vomiting beginning this morning. History of lupus, hypertension, diabetes. EXAM: CT HEAD WITHOUT CONTRAST TECHNIQUE: Contiguous axial images were obtained from the base of the skull through the  vertex without intravenous contrast. COMPARISON:  MRI head Jun 17, 2015 FINDINGS: BRAIN: The ventricles and sulci are normal for age. No intraparenchymal hemorrhage, mass effect nor midline shift. Patchy supratentorial white matter hypodensities within normal range for patient's age, though non-specific are most compatible with chronic small vessel ischemic disease. No acute large vascular territory infarcts. No abnormal extra-axial fluid collections. Basal cisterns are patent. VASCULAR: Mild calcific atherosclerosis of the carotid siphons. SKULL: No skull fracture. No significant scalp soft tissue swelling. SINUSES/ORBITS: The mastoid air-cells and included paranasal sinuses are well-aerated.The included ocular globes and orbital contents are non-suspicious. OTHER: Similar heterogeneous parotid glands, which may be is secondary to patient's autoimmune disorder. IMPRESSION: No acute intracranial process.  Negative CT HEAD for age. Electronically Signed   By: Awilda Metro M.D.   On: 01/05/2016 22:47    EKG: (Independently reviewed) sinus rhythm with ventricular rate 89 bpm, QTC 410 ms, nonspecific ST-T wave changes in inferior lateral leads, peaked P waves  EKG unchanged from previous EKG May 2017  Assessment/Plan Principal Problem:   TIA (transient ischemic attack) -Patient presented with symptoms of dysarthria and slurred speech with bilateral facial numbness without any other focal neurological deficits have completely resolved after several hours duration -Symptoms very mild and have resolved and also presented outside of window so was not a candidate for TPA regardless -Appreciate neurology assistance -Ischemic stroke order set initiated -Stat MRI/MRA brain pending -Obtain echocardiogram and carotid duplex -Risk factor stratification: Lipid panel and hemoglobin A1c -Antiplatelet with aspirin-increased to full dose noting was on baby aspirin prior to admission -PT/OT/SLP evaluation -Frequent neurological checks -Symptoms may be related to orthostatic hypotension noting patient on multiple high-dose antihypertensive medications and 2 diuretics -With reports of facial numbness and known hypokalemia and utilization a PPI need to check magnesium level  Active Problems:   Essential hypertension -Current blood pressure somewhat suboptimal -Hold chlorthalidone and hydrochlorothiazide (uncertain why utilizing both of these) -Hold Cozaar -Continue Lopressor but decrease from 100 mg twice a day to 25 twice a day -Check orthostatic vital signs noting patient did receive 1 L of IV fluids in the ER -Has mild hypokalemia secondary to preadmission utilization of thiazide diuretic so we'll continue oral potassium for now    Acute kidney injury -Baseline renal function in August 2017: 10/0.84 -Current renal function: 30/2.00 -Suspect dehydration from concomitant use of 2 thiazide diuretics and recent reports of nausea with emesis (low urine sp gravity suggestive of compensatory efforts of kidneys to hold onto free water) -Hold antihypertensives especially ARB as well as diuretics -IV fluids at 100 per hour    Diabetes mellitus, type 2  -Continue  preadmission Lantus -Follow up on hemoglobin A1c as above -SSI    Dyslipidemia -Continue Lipitor    Sciatica of left side -Ongoing problem that in the past has responded to cortisone injections -Received IM Toradol at PCP office on Tuesday -Continues to have chronic left leg weakness secondary to pain -Continue preadmission nor code    SVT (supraventricular tachycardia)/  S/P radiofrequency ablation operation for arrhythmia -Continue beta blocker with dose adjustments as noted above     SLE (systemic lupus erythematosus)  -Continue preadmission prednisone       DVT prophylaxis: Lovenox Code Status: Full  Family Communication: No family at bedside Disposition Plan: Anticipate discharge back to preadmission home environment when medically stable Consults called: Neurology/[place]    Adael Culbreath L. ANP-BC [facility] Pager [phone]   If 7PM-7AM, please contact night-coverage [email or web address] Password [facility]  01/06/2016,  7:44 AM

## 2016-01-07 ENCOUNTER — Observation Stay (HOSPITAL_BASED_OUTPATIENT_CLINIC_OR_DEPARTMENT_OTHER): Payer: Medicare Other

## 2016-01-07 ENCOUNTER — Encounter (HOSPITAL_COMMUNITY): Payer: Self-pay | Admitting: General Practice

## 2016-01-07 DIAGNOSIS — G459 Transient cerebral ischemic attack, unspecified: Secondary | ICD-10-CM

## 2016-01-07 DIAGNOSIS — N179 Acute kidney failure, unspecified: Secondary | ICD-10-CM | POA: Diagnosis not present

## 2016-01-07 DIAGNOSIS — G43109 Migraine with aura, not intractable, without status migrainosus: Secondary | ICD-10-CM

## 2016-01-07 LAB — GLUCOSE, CAPILLARY
GLUCOSE-CAPILLARY: 102 mg/dL — AB (ref 65–99)
Glucose-Capillary: 76 mg/dL (ref 65–99)
Glucose-Capillary: 163 mg/dL — ABNORMAL HIGH (ref 65–99)

## 2016-01-07 LAB — BASIC METABOLIC PANEL
ANION GAP: 11 (ref 5–15)
BUN: 24 mg/dL — ABNORMAL HIGH (ref 6–20)
CALCIUM: 9.1 mg/dL (ref 8.9–10.3)
CO2: 24 mmol/L (ref 22–32)
CREATININE: 1.34 mg/dL — AB (ref 0.44–1.00)
Chloride: 107 mmol/L (ref 101–111)
GFR, EST AFRICAN AMERICAN: 46 mL/min — AB (ref >60)
GFR, EST NON AFRICAN AMERICAN: 40 mL/min — AB (ref >60)
GLUCOSE: 71 mg/dL (ref 65–99)
Potassium: 3.7 mmol/L (ref 3.5–5.1)
Sodium: 142 mmol/L (ref 135–145)

## 2016-01-07 LAB — LIPID PANEL
CHOL/HDL RATIO: 2.8 ratio
Cholesterol: 124 mg/dL (ref 0–200)
HDL: 45 mg/dL (ref >40)
LDL CALC: 62 mg/dL (ref 0–99)
Triglycerides: 86 mg/dL (ref <150)
VLDL: 17 mg/dL (ref 0–40)

## 2016-01-07 LAB — C-REACTIVE PROTEIN: CRP: 3.8 mg/dL — AB (ref <1.0)

## 2016-01-07 LAB — SEDIMENTATION RATE: Sed Rate: 38 mm/hr — ABNORMAL HIGH (ref 0–22)

## 2016-01-07 LAB — ECHOCARDIOGRAM COMPLETE

## 2016-01-07 LAB — MAGNESIUM: MAGNESIUM: 2 mg/dL (ref 1.7–2.4)

## 2016-01-07 MED ORDER — POTASSIUM CHLORIDE CRYS ER 20 MEQ PO TBCR
40.0000 meq | EXTENDED_RELEASE_TABLET | Freq: Once | ORAL | Status: AC
  Administered 2016-01-07: 40 meq via ORAL
  Filled 2016-01-07: qty 2

## 2016-01-07 MED ORDER — ENOXAPARIN SODIUM 40 MG/0.4ML ~~LOC~~ SOLN
40.0000 mg | SUBCUTANEOUS | Status: DC
  Administered 2016-01-07: 40 mg via SUBCUTANEOUS
  Filled 2016-01-07: qty 0.4

## 2016-01-07 NOTE — Discharge Instructions (Signed)
Follow with Primary MD Rocky MorelOSTAND, ROBERT, MD in 7 days   Get CBC, CMP, 2 view Chest X ray checked  by Primary MD or SNF MD in 5-7 days ( we routinely change or add medications that can affect your baseline labs and fluid status, therefore we recommend that you get the mentioned basic workup next visit with your PCP, your PCP may decide not to get them or add new tests based on their clinical decision)   Activity: As tolerated with Full fall precautions use walker/cane & assistance as needed   Disposition Home    Diet:  Heart Healthy Low Carb.  For Heart failure patients - Check your Weight same time everyday, if you gain over 2 pounds, or you develop in leg swelling, experience more shortness of breath or chest pain, call your Primary MD immediately. Follow Cardiac Low Salt Diet and 1.5 lit/day fluid restriction.   On your next visit with your primary care physician please Get Medicines reviewed and adjusted.   Please request your Prim.MD to go over all Hospital Tests and Procedure/Radiological results at the follow up, please get all Hospital records sent to your Prim MD by signing hospital release before you go home.   If you experience worsening of your admission symptoms, develop shortness of breath, life threatening emergency, suicidal or homicidal thoughts you must seek medical attention immediately by calling 911 or calling your MD immediately  if symptoms less severe.  You Must read complete instructions/literature along with all the possible adverse reactions/side effects for all the Medicines you take and that have been prescribed to you. Take any new Medicines after you have completely understood and accpet all the possible adverse reactions/side effects.   Do not drive, operate heavy machinery, perform activities at heights, swimming or participation in water activities or provide baby sitting services if your were admitted for syncope or siezures until you have seen by Primary MD  or a Neurologist and advised to do so again.  Do not drive when taking Pain medications.    Do not take more than prescribed Pain, Sleep and Anxiety Medications  Special Instructions: If you have smoked or chewed Tobacco  in the last 2 yrs please stop smoking, stop any regular Alcohol  and or any Recreational drug use.  Wear Seat belts while driving.   Please note  You were cared for by a hospitalist during your hospital stay. If you have any questions about your discharge medications or the care you received while you were in the hospital after you are discharged, you can call the unit and asked to speak with the hospitalist on call if the hospitalist that took care of you is not available. Once you are discharged, your primary care physician will handle any further medical issues. Please note that NO REFILLS for any discharge medications will be authorized once you are discharged, as it is imperative that you return to your primary care physician (or establish a relationship with a primary care physician if you do not have one) for your aftercare needs so that they can reassess your need for medications and monitor your lab values.

## 2016-01-07 NOTE — Care Management Note (Signed)
Case Management Note  Patient Details  Name: Anna Lester MRN: [id number] Date of Birth: [birth year]  Subjective/Objective:                    Action/Plan: Pt discharging home with orders for [facility] services. CM met with her a provided her a list of [place] agencies. She selected [place]. Santiago Glad with [facility] notified and accepted the referral. Pt also with orders for 3 in 1. Larene Beach with [facility] notified and will deliver the equipment to the room.   Expected Discharge Date:                  Expected Discharge Plan:  [place]  In-House Referral:     Discharge planning Services  CM Consult  Post Acute Care Choice:  Durable Medical Equipment, Home Health Choice offered to:  Patient  DME Arranged:  3-N-1 DME Agency:  [place] Arranged:  PT, OT, Nurse's Aide [place] Agency:  [place]  Status of Service:  Completed, signed off  If discussed at [place] of Stay Meetings, dates discussed:    Additional Comments:  Pollie Friar, RN 01/07/2016, 3:13 PM

## 2016-01-07 NOTE — Progress Notes (Signed)
  Echocardiogram 2D Echocardiogram has been performed.  Anna SavoyCasey N Niam Lester 01/07/2016, 8:54 AM

## 2016-01-07 NOTE — Care Management Note (Signed)
Case Management Note  Patient Details  Name: Anna SimasLillian Lester MRN: [id number] Date of Birth: Jun 25, 1950  Subjective/Objective:       Patient presented with dysarthria and slurred speech. Lives at home with granddaughter. CM will follow for discharge needs pending PT/OT evals and physician orders.              Action/Plan:   Expected Discharge Date:                  Expected Discharge Plan:     In-House Referral:     Discharge planning Services     Post Acute Care Choice:    Choice offered to:     DME Arranged:    DME Agency:     HH Arranged:    HH Agency:     Status of Service:     If discussed at [employer] Length of Stay Meetings, dates discussed:    Additional Comments:  Anda KraftRobarge, Nayquan Evinger C, RN 01/07/2016, 12:09 PM

## 2016-01-07 NOTE — Progress Notes (Signed)
*  PRELIMINARY RESULTS* Vascular Ultrasound Carotid Duplex (Doppler) has been completed.  Preliminary findings: Bilateral 1-39% ICA stenosis, antegrade vertebral flow. Incidental- heterogeneous thyroid bilaterally.  Anna FischerCharlotte C Kayren Lester 01/07/2016, 9:38 AM

## 2016-01-07 NOTE — Progress Notes (Signed)
Pt's grandson who is in the armed services is attempting to get special leave to visit Pt. The [employer] cross keeps calling trying to get information about the pt to substantiate the grandson's " Rickey BarbaraChristopher [place]" request case number [id number], the [employer] call back number is [phone]. The Pt does not feel that she needs that much attention and was not very enthusiastic about the idea so I did not call the [employer] back. If social worker or Case manager would like to to pursue this this is the information.

## 2016-01-07 NOTE — Care Management Obs Status (Signed)
MEDICARE OBSERVATION STATUS NOTIFICATION   Patient Details  Name: Anna Lester MRN: [id number] Date of Birth: 09/19/1950   Medicare Observation Status Notification Given:  Yes (MRI negative)    Kermit BaloKelli F Kaileigh Viswanathan, RN 01/07/2016, 11:36 AM

## 2016-01-07 NOTE — Evaluation (Signed)
Physical Therapy Evaluation Patient Details Name: Anna Lester MRN: [id number] DOB: 03-22-1950 Today's Date: 01/07/2016   History of Present Illness  Anna Lester is a 65 y.o. female with medical history significant for diabetes, hypertension, irritable bowel syndrome, lupus on chronic steroids and sciatica who began experiencing dysarthria and slurred speech with bilateral facial numbness. MRI on 12/7 negative for acute infarct.   Clinical Impression  Pt admitted with above diagnosis. Pt currently with functional limitations due to the deficits listed below (see PT Problem List). At the time of PT eval pt was able to perform transfers and ambulation with gross min assist for balance support and safety. Pt had a difficult time tolerating functional activity due to pain in RLE (pt reports she has sciatica effecting the RLE). We discussed the benefits of short term rehab at d/c however pt declining going anywhere but home and is agreeable to home health therapies to follow instead. Pt will benefit from skilled PT to increase their independence and safety with mobility to allow discharge to the venue listed below.       Follow Up Recommendations Home health PT;Supervision for mobility/OOB    Equipment Recommendations  3in1 (PT)    Recommendations for Other Services       Precautions / Restrictions Precautions Precautions: Fall Restrictions Weight Bearing Restrictions: No      Mobility  Bed Mobility Overal bed mobility: Needs Assistance Bed Mobility: Supine to Sit;Sit to Supine     Supine to sit: Min assist Sit to supine: Min guard   General bed mobility comments: Pt was not able to transition to EOB without utilizing the log roll. Assist provided at the shoulders for trunk elevation to full sitting position.   Transfers Overall transfer level: Needs assistance Equipment used: Rolling walker (2 wheeled) Transfers: Sit to/from Stand Sit to Stand: Min assist          General transfer comment: Min assist to boost up. Cues for hand placement.  Ambulation/Gait Ambulation/Gait assistance: Min assist Ambulation Distance (Feet): 40 Feet Assistive device: Rolling walker (2 wheeled) Gait Pattern/deviations: Step-through pattern;Decreased stride length;Trunk flexed Gait velocity: Decreased Gait velocity interpretation: Below normal speed for age/gender General Gait Details: Pt ambulated ~20' out to the hall and then back to the bed. She could not tolerate upright posture for a long period of time and took several rest breaks with forearm on the walker to lean on.   Stairs            Wheelchair Mobility    Modified Rankin (Stroke Patients Only) Modified Rankin (Stroke Patients Only) Pre-Morbid Rankin Score: No symptoms Modified Rankin: No significant disability     Balance Overall balance assessment: Needs assistance   Sitting balance-Leahy Scale: Good     Standing balance support: Bilateral upper extremity supported Standing balance-Leahy Scale: Poor Standing balance comment: RW for UE support                             Pertinent Vitals/Pain Pain Assessment: Faces Faces Pain Scale: Hurts whole lot Pain Location: RLE - pt reports she has sciatica Pain Descriptors / Indicators: Aching;Grimacing;Guarding Pain Intervention(s): Limited activity within patient's tolerance;Monitored during session;Repositioned    Home Living Family/patient expects to be discharged to:: Private residence Living Arrangements: Other relatives (granddaughter) Available Help at Discharge: Family;Available PRN/intermittently (granddaughter in school during the day 79(67 y/o)) Type of Home: Apartment Home Access: Level entry     Home Layout: One level  Home Equipment: Walker - 4 wheels      Prior Function Level of Independence: Independent with assistive device(s)         Comments: Uses rollator for all mobility. Independent with ADL, does not  get in bath tub-only sponge bathes, drives, does cooking and cleaning     Hand Dominance   Dominant Hand: Right    Extremity/Trunk Assessment   Upper Extremity Assessment: Defer to OT evaluation           Lower Extremity Assessment: RLE deficits/detail RLE Deficits / Details: Decreased strength and pain limiting mobility. Pt reports she has sciatica which effects her RLE at baseline.    Cervical / Trunk Assessment: Normal  Communication   Communication: No difficulties  Cognition Arousal/Alertness: Awake/alert Behavior During Therapy: WFL for tasks assessed/performed Overall Cognitive Status: Within Functional Limits for tasks assessed                      General Comments      Exercises     Assessment/Plan    PT Assessment Patient needs continued PT services  PT Problem List Decreased strength;Decreased range of motion;Decreased activity tolerance;Decreased balance;Decreased mobility;Decreased knowledge of use of DME;Decreased safety awareness;Decreased knowledge of precautions;Pain          PT Treatment Interventions DME instruction;Gait training;Stair training;Functional mobility training;Therapeutic activities;Therapeutic exercise;Neuromuscular re-education;Patient/family education    PT Goals (Current goals can be found in the Care Plan section)  Acute Rehab PT Goals Patient Stated Goal: return to being independent PT Goal Formulation: With patient Time For Goal Achievement: 01/14/16 Potential to Achieve Goals: Good    Frequency Min 3X/week   Barriers to discharge Decreased caregiver support      Co-evaluation               End of Session Equipment Utilized During Treatment: Gait belt Activity Tolerance: Patient limited by pain Patient left: in bed;with call bell/phone within reach;with bed alarm set Nurse Communication: Mobility status    Functional Assessment Tool Used: Clinical judgement Functional Limitation: Mobility: Walking  and moving around Mobility: Walking and Moving Around Current Status ([id number]): At least 20 percent but less than 40 percent impaired, limited or restricted Mobility: Walking and Moving Around Goal Status [phone]): At least 1 percent but less than 20 percent impaired, limited or restricted    Time: [id number] PT Time Calculation (min) (ACUTE ONLY): 19 min   Charges:   PT Evaluation $PT Eval Moderate Complexity: 1 Procedure     PT G Codes:   PT G-Codes **NOT FOR INPATIENT CLASS** Functional Assessment Tool Used: Clinical judgement Functional Limitation: Mobility: Walking and moving around Mobility: Walking and Moving Around Current Status ([id number](G8978): At least 20 percent but less than 40 percent impaired, limited or restricted Mobility: Walking and Moving Around Goal Status [phone](G8979): At least 1 percent but less than 20 percent impaired, limited or restricted    Marylynn PearsonLaura D Bowdy Bair 01/07/2016, 1:26 PM   Conni SlipperLaura Faheem Ziemann, PT, DPT Acute Rehabilitation Services Pager: [phone]

## 2016-01-07 NOTE — Progress Notes (Signed)
STROKE TEAM PROGRESS NOTE   SUBJECTIVE (INTERVAL HISTORY) No family at bedside. She stated that she feels better now. Right sided numbness is gone. Pt came in with AKI and elevated Cre, now much improved. Pt feels better also. Hypercoagulable pending.    OBJECTIVE Temp:  [97.7 F (36.5 C)-98.5 F (36.9 C)] 98.2 F (36.8 C) (12/08 0605) Pulse Rate:  [74-86] 84 (12/08 0605) Cardiac Rhythm: Normal sinus rhythm (12/07 1900) Resp:  [13-32] 16 (12/08 0605) BP: (108-150)/(59-84) 138/63 (12/08 0605) SpO2:  [94 %-100 %] 98 % (12/08 0605)  CBC:   Recent Labs Lab 01/06/16 0010 01/06/16 0025  WBC 11.8*  --   NEUTROABS 8.5*  --   HGB 11.8* 12.2  HCT 36.5 36.0  MCV 82.4  --   PLT 193  --     Basic Metabolic Panel:   Recent Labs Lab 01/06/16 0010 01/06/16 0025 01/07/16 0548  NA 141 140 142  K 3.3* 3.2* 3.7  CL 106 106 107  CO2 23  --  24  GLUCOSE 88 89 71  BUN 30* 30* 24*  CREATININE 2.01* 2.00* 1.34*  CALCIUM 9.8  --  9.1  MG 1.9  --  2.0    Lipid Panel:     Component Value Date/Time   CHOL 124 01/07/2016 0548   TRIG 86 01/07/2016 0548   HDL 45 01/07/2016 0548   CHOLHDL 2.8 01/07/2016 0548   VLDL 17 01/07/2016 0548   LDLCALC 62 01/07/2016 0548   HgbA1c:  Lab Results  Component Value Date   HGBA1C 7.2 (H) 04/26/2014   Urine Drug Screen:     Component Value Date/Time   LABOPIA NONE DETECTED 01/05/2016 2316   COCAINSCRNUR NONE DETECTED 01/05/2016 2316   LABBENZ NONE DETECTED 01/05/2016 2316   AMPHETMU NONE DETECTED 01/05/2016 2316   THCU NONE DETECTED 01/05/2016 2316   LABBARB NONE DETECTED 01/05/2016 2316      IMAGING I have personally reviewed the radiological images below and agree with the radiology interpretations.  Dg Chest 2 View 01/05/2016 No active cardiopulmonary disease.   Ct Head Wo Contrast 01/05/2016 No acute intracranial process.  Negative CT HEAD for age.   Mr Jodene Nam Head Wo Contrast 01/06/2016 No proximal large vessel occlusion or  flow-limiting stenosis of the intracranial circulation.   Mr Brain Wo Contrast 01/06/2016 No acute stroke.  Mild atrophy and small vessel disease.   CUS  Preliminary findings: Bilateral 1-39% ICA stenosis, antegrade vertebral flow. Incidental- heterogeneous thyroid bilaterally.  TTE  Left ventricle: The cavity size was normal. There was mild focal   basal hypertrophy of the septum. Systolic function was normal.   The estimated ejection fraction was in the range of 60% to 65%.   Wall motion was normal; there were no regional wall motion   abnormalities. Left ventricular diastolic function parameters   were normal. - Aortic valve: Trileaflet; normal thickness, mildly calcified   leaflets. There was mild regurgitation. - Mitral valve: There was mild regurgitation. - Pulmonic valve: There was trivial regurgitation. - Pulmonary arteries: PA peak pressure: 51 mm Hg (S). Impressions: - The right ventricular systolic pressure was increased consistent   with moderate pulmonary hypertension.   PHYSICAL EXAM  Temp:  [97.7 F (36.5 C)-98.5 F (36.9 C)] 98.2 F (36.8 C) (12/08 0605) Pulse Rate:  [74-86] 84 (12/08 0605) Resp:  [13-32] 16 (12/08 0605) BP: (108-150)/(59-84) 138/63 (12/08 0605) SpO2:  [94 %-100 %] 98 % (12/08 0605)  General - morbid obesity, well developed, in no  apparent distress.  Ophthalmologic - Fundi not visualized due to eye movement.  Cardiovascular - Regular rate and rhythm.  Mental Status -  Level of arousal and orientation to time, place, and person were intact. Language including expression, naming, repetition, comprehension was assessed and found intact. Fund of Knowledge was assessed and was intact.  Cranial Nerves II - XII - II - Visual field intact OU. III, IV, VI - Extraocular movements intact. V - Facial sensation symmetric VII - Facial movement intact bilaterally. VIII - Hearing & vestibular intact bilaterally. X - Palate elevates  symmetrically. XI - Chin turning & shoulder shrug intact bilaterally. XII - Tongue protrusion intact.  Motor Strength - The patient's strength exam 4/5 through out. Bulk was normal and fasciculations were absent.   Motor Tone - Muscle tone was assessed at the neck and appendages and was normal.  Reflexes - The patient's reflexes were 1+ in all extremities and she had no pathological reflexes.  Sensory - Light touch, temperature/pinprick were assessed and were symmetric    Coordination - The patient had normal movements in the hands with no ataxia or dysmetria.  Tremor was absent.  Gait and Station - deferred   ASSESSMENT/PLAN Ms. Sumayya Muha is a 65 y.o. female with history of diabetes, hypertension, irritable bowel, lupus, thyroid disease, chronic steroid use and sciatica presenting with word finding difficulty, slurred speech, right lower extremity weakness and gait instability with fall at home prior to admission. She did not receive IV t-PA due to delay in arrival, symptoms resolved.   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 hx of lupus (ANA, anti-DNA, double-stranded, beta 2 glycoprotein, CRP, cardiolipin antibody, homocystine, lupus anticoagulant, ESR)  Lovenox 30 mg sq daily for VTE prophylaxis Diet heart healthy/carb modified Room service appropriate? Yes; Fluid consistency: Thin  aspirin 81 mg daily prior to admission, now on aspirin 325 mg daily. Continue ASA on discharge.  Patient counseled to be compliant with her antithrombotic medications  Ongoing aggressive stroke risk factor management  Therapy recommendations:  Home health PT OT  Disposition:  home  ?  Lupus  Diagnosed in 2009 in [place] as per pt  On low dose daily prednisone  Hypercoagulable work up pending to rule out antiphospholipid syndrome  Repeat autoimmune markers - pending  SVT s/p ablation  Still complain intermittent palpitation after ablation  But loop recorder did not show arrhythmia as per pt  Loop recorder remove in 10/2015  Tele showed sinus rhythm  Hypertension  Stable  Permissive hypertension (OK if < 220/120) but gradually normalize in 5-7 days  Long-term BP goal normotensive  Hyperlipidemia  Home meds:  Lipitor 40 mg daily, resumed in hospital  LDL 62, goal < 70  Continue statin at discharge  Diabetes  HgbA1c pending, goal < 7.0  SSI  On lantus  Close PCP follow up  CBG monitoring stable so far  Other Stroke Risk Factors  Advanced age  Former Cigarette smoker  Morbid obesity - need weight loss and regular exercise  Other [facility] day # 0  Neurology will sign off. Please call with questions. No neuro follow up needed at this time.  Thanks for the consult.  Rosalin Hawking, MD PhD Stroke Neurology 01/07/2016 2:55 PM     To contact Stroke Continuity provider, please refer to [email or web address] After hours, contact General Neurology

## 2016-01-07 NOTE — Progress Notes (Signed)
Patient is being discharged home. Discharge instructions were given to patient and family 

## 2016-01-07 NOTE — Progress Notes (Signed)
SLP Cancellation Note  Patient Details Name: Anna Lester MRN: [id number] DOB: [birth year]   Cancelled treatment:       Reason Eval/Treat Not Completed: SLP screened, no needs identified, will sign off   Gracie Gupta, Riley NearingBonnie Caroline 01/07/2016, 3:05 PM

## 2016-01-07 NOTE — Discharge Summary (Addendum)
Anna SimasLillian Kemnitz OZH:[id number] DOB: [birth year] DOA: 01/05/2016  PCP: Rocky MorelOSTAND, ROBERT, MD  Admit date: 01/05/2016  Discharge date: 01/07/2016  Admitted From: Home   Disposition:  Home   Recommendations for Outpatient Follow-up:   Follow up with PCP in 1-2 weeks  PCP Please obtain BMP/CBC, 2 view CXR in 1week,  (see Discharge instructions)   PCP Please follow up on the following pending results: follow on pending hypercoagulable panel and lupus serology, pending A1c   Home Health: PT, OT, aide  Equipment/Devices: 3 and 1 walker Consultations: Neuro Discharge Condition: Stable CODE STATUS: Full  Diet Recommendation: Diet heart healthy/carb modified   Chief Complaint  Patient presents with  . Stroke Symptoms     Brief history of present illness from the day of admission and additional interim summary    Anna Lester is a 65 y.o. female with medical history significant for diabetes, hypertension, irritable bowel syndrome, lupus on chronic steroids and sciatica who began experiencing dysarthria and slurred speech with bilateral facial numbness around 10:00 am on 12/6. Symptoms persisted for multiple hours until she presented to the ER around 9 PM on the same date. Prior to arrival she developed a headache with subsequent development of nausea and vomiting and feeling of generalized weakness. She did not have any focal motor deficits/weakness and denies dizziness or vertigo type symptoms. She has not had any documented fevers although she reported chills. She denies dysuria or diarrhea. She states on Tuesday she went to her primary care physician and was given a shot of Toradol for her sciatica.  Hospital issues addressed     1. ? TIA vs dehydration with non specific symptoms - She had nausea and vomiting and was on a  diuretic and ACE inhibitor prior to admission, she was in acute renal failure and blood pressure was soft, she had weakness all over in all 4 extremities with some tingling numbness in all 4 extremities but at times reported more in the right upper out. She had full workup which included MRI/MRA brain, CT head, echocardiogram and carotid duplex which was all nonacute and unremarkable, LDL was less than 70, she will be continued on aspirin and statin, completely symptom free, case discussed with neurologist Dr. Juliann ParesX you to be discharged home. She is back to her baseline, no speech issues, no remaining deficits. She does have generalized weakness for which home PTOT and walker will be ordered.  2. Essential hypertension. We'll continue Lopressor upon discharge, hold HCTZ and Cozaar. Blood pressure and renal function to be monitored by PCP.  3. ARF due to hydration in combination with diuretics and ARB. Hydrated, BP and ARF much improved, hold offending medications PCP to monitor BMP.  4. DM type II. Continue home regimen.  5. HX of lupus. On prednisone, hypercoagulable panel has been ordered by neurology along with lupus workup. Serologies are pending request PCP to monitor next visit.  6. Dyslipidemia. On statin continue LDL was less than 70.  7. History of SVT. Stable continue beta blocker.  Discharge diagnosis     Principal Problem:   TIA (transient ischemic attack) Active Problems:   SVT (supraventricular tachycardia) (HCC)   SLE (systemic lupus erythematosus) (HCC)   Essential hypertension   Diabetes mellitus, type 2 (HCC)   S/P radiofrequency ablation operation for arrhythmia   Sciatica of left side   Acute kidney injury (HCC)   Complicated migraine    Discharge instructions    Discharge Instructions    Diet - low sodium heart healthy    Complete by:  As directed    Discharge instructions    Complete by:  As directed    Follow with Primary MD Rocky Morel, MD in 7 days    Get CBC, CMP, 2 view Chest X ray checked  by Primary MD or SNF MD in 5-7 days ( we routinely change or add medications that can affect your baseline labs and fluid status, therefore we recommend that you get the mentioned basic workup next visit with your PCP, your PCP may decide not to get them or add new tests based on their clinical decision)   Activity: As tolerated with Full fall precautions use walker/cane & assistance as needed   Disposition Home    Diet:  Heart Healthy Low Carb.  For Heart failure patients - Check your Weight same time everyday, if you gain over 2 pounds, or you develop in leg swelling, experience more shortness of breath or chest pain, call your Primary MD immediately. Follow Cardiac Low Salt Diet and 1.5 lit/day fluid restriction.   On your next visit with your primary care physician please Get Medicines reviewed and adjusted.   Please request your Prim.MD to go over all Hospital Tests and Procedure/Radiological results at the follow up, please get all Hospital records sent to your Prim MD by signing hospital release before you go home.   If you experience worsening of your admission symptoms, develop shortness of breath, life threatening emergency, suicidal or homicidal thoughts you must seek medical attention immediately by calling 911 or calling your MD immediately  if symptoms less severe.  You Must read complete instructions/literature along with all the possible adverse reactions/side effects for all the Medicines you take and that have been prescribed to you. Take any new Medicines after you have completely understood and accpet all the possible adverse reactions/side effects.   Do not drive, operate heavy machinery, perform activities at heights, swimming or participation in water activities or provide baby sitting services if your were admitted for syncope or siezures until you have seen by Primary MD or a Neurologist and advised to do so again.  Do  not drive when taking Pain medications.    Do not take more than prescribed Pain, Sleep and Anxiety Medications  Special Instructions: If you have smoked or chewed Tobacco  in the last 2 yrs please stop smoking, stop any regular Alcohol  and or any Recreational drug use.  Wear Seat belts while driving.   Please note  You were cared for by a hospitalist during your hospital stay. If you have any questions about your discharge medications or the care you received while you were in the hospital after you are discharged, you can call the unit and asked to speak with the hospitalist on call if the hospitalist that took care of you is not available. Once you are discharged, your primary care physician will handle any further medical issues. Please note that NO REFILLS for any discharge medications will be authorized once you are  discharged, as it is imperative that you return to your primary care physician (or establish a relationship with a primary care physician if you do not have one) for your aftercare needs so that they can reassess your need for medications and monitor your lab values.   Increase activity slowly    Complete by:  As directed       Discharge Medications     Medication List    STOP taking these medications   losartan-hydrochlorothiazide 100-25 MG tablet Commonly known as:  HYZAAR     TAKE these medications   ammonium lactate 12 % lotion Commonly known as:  LAC-HYDRIN Apply 1 application topically 2 (two) times daily as needed for dry skin. Apply to both feet Indications: Abnormal Dryness of Skin, Eyes or Mucous Membranes   aspirin 81 MG EC tablet Take 81 mg by mouth daily.   atorvastatin 40 MG tablet Commonly known as:  LIPITOR Take 40 mg by mouth daily at 6 PM.   chlorthalidone 25 MG tablet Commonly known as:  HYGROTON Take 25 mg by mouth daily.   HYDROcodone-acetaminophen 5-325 MG tablet Commonly known as:  NORCO/VICODIN Take 1 tablet by mouth every 6  (six) hours as needed for moderate pain or severe pain.   insulin glargine 100 unit/mL Sopn Commonly known as:  LANTUS Inject 30 Units into the skin at bedtime.   metoprolol 100 MG tablet Commonly known as:  LOPRESSOR Take 1 tablet (100 mg total) by mouth 2 (two) times daily.   omeprazole 40 MG capsule Commonly known as:  PRILOSEC Take 40 mg by mouth daily.   ondansetron 4 MG disintegrating tablet Commonly known as:  ZOFRAN ODT Take 1 tablet (4 mg total) by mouth every 8 (eight) hours as needed for nausea or vomiting.   potassium chloride SA 20 MEQ tablet Commonly known as:  K-DUR,KLOR-CON Take 20 mEq by mouth daily.   predniSONE 10 MG tablet Commonly known as:  DELTASONE Take 10 mg by mouth daily with breakfast.   TRAVATAN Z 0.004 % Soln ophthalmic solution Generic drug:  Travoprost (BAK Free) Place 1 drop into both eyes at bedtime.       Follow-up Information    Rocky Morel, MD. Schedule an appointment as soon as possible for a visit in 1 week(s).   Specialty:  Internal Medicine Contact information: [address] [place] [address] [phone]        Xu,Jindong, MD. Schedule an appointment as soon as possible for a visit in 1 month(s).   Specialty:  Neurology Contact information: [address] [place] [address] [phone]           Major procedures and Radiology Reports - PLEASE review detailed and final reports thoroughly  -     Carotid US - Carotid Duplex (Doppler) has been completed.  Preliminary findings: Bilateral 1-39% ICA stenosis, antegrade vertebral flow. Incidental- heterogeneous thyroid bilaterally.  TTE Left ventricle: The cavity size was normal. There was mild focal  basal hypertrophy of the septum. Systolic function was normal. The estimated ejection fraction was in the range of 60% to 65%. Wall motion was normal; there were no regional wall motion abnormalities. Left ventricular diastolic function  parameters   were normal. - Aortic valve: Trileaflet; normal thickness, mildly calcified  leaflets. There was mild regurgitation. - Mitral valve: There was mild regurgitation. - Pulmonic valve: There was trivial regurgitation. - Pulmonary arteries: PA peak pressure: 51 mm Hg (S).   Dg Chest  2 View  Result Date: 01/05/2016 CLINICAL DATA:  Right-sided chest pain, emesis and jaw pain, onset today. EXAM: CHEST  2 VIEW COMPARISON:  07/07/2014 FINDINGS: The heart size and mediastinal contours are within normal limits. Both lungs are clear. The visualized skeletal structures are unremarkable. IMPRESSION: No active cardiopulmonary disease. Electronically Signed   By: Ellery Plunk M.D.   On: 01/05/2016 22:02   Ct Head Wo Contrast  Result Date: 01/05/2016 CLINICAL DATA:  Weakness, word-finding difficulties, headache and vomiting beginning this morning. History of lupus, hypertension, diabetes. EXAM: CT HEAD WITHOUT CONTRAST TECHNIQUE: Contiguous axial images were obtained from the base of the skull through the vertex without intravenous contrast. COMPARISON:  MRI head Jun 17, 2015 FINDINGS: BRAIN: The ventricles and sulci are normal for age. No intraparenchymal hemorrhage, mass effect nor midline shift. Patchy supratentorial white matter hypodensities within normal range for patient's age, though non-specific are most compatible with chronic small vessel ischemic disease. No acute large vascular territory infarcts. No abnormal extra-axial fluid collections. Basal cisterns are patent. VASCULAR: Mild calcific atherosclerosis of the carotid siphons. SKULL: No skull fracture. No significant scalp soft tissue swelling. SINUSES/ORBITS: The mastoid air-cells and included paranasal sinuses are well-aerated.The included ocular globes and orbital contents are non-suspicious. OTHER: Similar heterogeneous parotid glands, which may be is secondary to patient's autoimmune disorder. IMPRESSION: No acute intracranial  process.  Negative CT HEAD for age. Electronically Signed   By: Awilda Metro M.D.   On: 01/05/2016 22:47   Mr Maxine Glenn Head Wo Contrast  Result Date: 01/06/2016 CLINICAL DATA:  Dysarthria and slurred speech with BILATERAL facial numbness beginning 10 a.m. on 01/05/2016. Symptoms persisted for multiple hours. Stroke risk factors include hypertension, diabetes, and lupus. EXAM: MRI HEAD WITHOUT CONTRAST MRA HEAD WITHOUT CONTRAST TECHNIQUE: Multiplanar, multiecho pulse sequences of the brain and surrounding structures were obtained without intravenous contrast. Angiographic images of the head were obtained using MRA technique without contrast. COMPARISON:  CT head 01/05/2016 was unremarkable. FINDINGS: MRI HEAD FINDINGS Brain: No evidence for acute infarction, hemorrhage, mass lesion, hydrocephalus, or extra-axial fluid. Mild cerebral and cerebellar atrophy. Mild subcortical and periventricular T2 and FLAIR hyperintensities, likely chronic microvascular ischemic change. Vascular: Flow voids are maintained throughout the carotid, basilar, and vertebral arteries. There are no areas of chronic hemorrhage. Skull and upper cervical spine: Unremarkable visualized calvarium, skullbase, and cervical vertebrae. Pituitary, pineal, cerebellar tonsils unremarkable. No upper cervical cord lesions. Cervical spondylosis is notable at C3-4 and C4-5. Sinuses/Orbits: No orbital masses or proptosis. Globes appear symmetric. Sinuses appear well aerated, without evidence for air-fluid level. Other: No nasopharyngeal pathology or mastoid fluid. Scalp and other visualized extracranial soft tissues grossly unremarkable. MRA HEAD FINDINGS The internal carotid arteries are widely patent. No anterior circulation stenosis involving the anterior or middle cerebral arteries. Basilar artery is widely patent with vertebrals codominant. No posterior circulation disease of significance. No saccular aneurysm. IMPRESSION: No acute stroke.  Mild  atrophy and small vessel disease. No proximal large vessel occlusion or flow-limiting stenosis of the intracranial circulation. Electronically Signed   By: Elsie Stain M.D.   On: 01/06/2016 12:26   Mr Brain Wo Contrast  Result Date: 01/06/2016 CLINICAL DATA:  Dysarthria and slurred speech with BILATERAL facial numbness beginning 10 a.m. on 01/05/2016. Symptoms persisted for multiple hours. Stroke risk factors include hypertension, diabetes, and lupus. EXAM: MRI HEAD WITHOUT CONTRAST MRA HEAD WITHOUT CONTRAST TECHNIQUE: Multiplanar, multiecho pulse sequences of the brain and surrounding structures were obtained without intravenous contrast. Angiographic images of the  head were obtained using MRA technique without contrast. COMPARISON:  CT head 01/05/2016 was unremarkable. FINDINGS: MRI HEAD FINDINGS Brain: No evidence for acute infarction, hemorrhage, mass lesion, hydrocephalus, or extra-axial fluid. Mild cerebral and cerebellar atrophy. Mild subcortical and periventricular T2 and FLAIR hyperintensities, likely chronic microvascular ischemic change. Vascular: Flow voids are maintained throughout the carotid, basilar, and vertebral arteries. There are no areas of chronic hemorrhage. Skull and upper cervical spine: Unremarkable visualized calvarium, skullbase, and cervical vertebrae. Pituitary, pineal, cerebellar tonsils unremarkable. No upper cervical cord lesions. Cervical spondylosis is notable at C3-4 and C4-5. Sinuses/Orbits: No orbital masses or proptosis. Globes appear symmetric. Sinuses appear well aerated, without evidence for air-fluid level. Other: No nasopharyngeal pathology or mastoid fluid. Scalp and other visualized extracranial soft tissues grossly unremarkable. MRA HEAD FINDINGS The internal carotid arteries are widely patent. No anterior circulation stenosis involving the anterior or middle cerebral arteries. Basilar artery is widely patent with vertebrals codominant. No posterior circulation  disease of significance. No saccular aneurysm. IMPRESSION: No acute stroke.  Mild atrophy and small vessel disease. No proximal large vessel occlusion or flow-limiting stenosis of the intracranial circulation. Electronically Signed   By: Elsie StainJohn T Curnes M.D.   On: 01/06/2016 12:26    Micro Results     No results found for this or any previous visit (from the past 240 hour(s)).  Today   Subjective    Anna SimasLillian Brunner today has no headache,no chest abdominal pain,no new weakness tingling or numbness, feels much better wants to go home today.     Objective   Blood pressure 111/66, pulse 86, temperature 98.2 F (36.8 C), temperature source Oral, resp. rate 18, SpO2 100 %.   Intake/Output Summary (Last 24 hours) at 01/07/16 1318 Last data filed at 01/07/16 0300  Gross per 24 hour  Intake              965 ml  Output                0 ml  Net              965 ml    Exam Awake Alert, Oriented x 3, No new F.N deficits, Normal affect [place].AT,PERRAL Supple Neck,No JVD, No cervical lymphadenopathy appriciated.  Symmetrical Chest wall movement, Good air movement bilaterally, CTAB RRR,No Gallops,Rubs or new Murmurs, No Parasternal Heave +ve B.Sounds, Abd Soft, Non tender, No organomegaly appriciated, No rebound -guarding or rigidity. No Cyanosis, Clubbing or edema, No new Rash or bruise   Data Review   CBC w Diff: Lab Results  Component Value Date   WBC 11.8 (H) 01/06/2016   HGB 12.2 01/06/2016   HCT 36.0 01/06/2016   PLT 193 01/06/2016   LYMPHOPCT 22 01/06/2016   MONOPCT 4 01/06/2016   EOSPCT 1 01/06/2016   BASOPCT 0 01/06/2016    CMP: Lab Results  Component Value Date   NA 142 01/07/2016   K 3.7 01/07/2016   CL 107 01/07/2016   CO2 24 01/07/2016   BUN 24 (H) 01/07/2016   CREATININE 1.34 (H) 01/07/2016   PROT 6.9 01/06/2016   ALBUMIN 3.8 01/06/2016   BILITOT 0.6 01/06/2016   ALKPHOS 107 01/06/2016   AST 32 01/06/2016   ALT 24 01/06/2016  .   Total Time in preparing  paper work, data evaluation and todays exam - 35 minutes  Leroy SeaSINGH,Tyshon Fanning K M.D on 01/07/2016 at 1:18 PM  [facility]   Office  [phone]

## 2016-01-07 NOTE — Progress Notes (Signed)
[employer] called to obtain information on patient in regards to an immediate family member that is in the army, wanting leave to take care of her.  [facility] [employer], contact number for her is [phone]. The case number is #[id number].

## 2016-01-08 LAB — ANTI-DNA ANTIBODY, DOUBLE-STRANDED: DS DNA AB: 1 [IU]/mL (ref 0–9)

## 2016-01-10 LAB — VAS US CAROTID
LEFT ECA DIAS: -14 cm/s
LEFT VERTEBRAL DIAS: 18 cm/s
LICADDIAS: -18 cm/s
LICAPDIAS: -10 cm/s
LICAPSYS: -47 cm/s
Left CCA dist dias: -20 cm/s
Left CCA dist sys: -88 cm/s
Left CCA prox dias: 25 cm/s
Left CCA prox sys: 121 cm/s
Left ICA dist sys: -72 cm/s
RCCAPDIAS: 19 cm/s
RIGHT ECA DIAS: -18 cm/s
RIGHT VERTEBRAL DIAS: -15 cm/s
Right CCA prox sys: 127 cm/s
Right cca dist sys: -115 cm/s

## 2016-01-10 LAB — LUPUS ANTICOAGULANT PANEL
DRVVT: 34.9 s (ref 0.0–47.0)
PTT Lupus Anticoagulant: 29.5 s (ref 0.0–51.9)

## 2016-01-10 LAB — ANTINUCLEAR ANTIBODIES, IFA: ANTINUCLEAR ANTIBODIES, IFA: POSITIVE — AB

## 2016-01-10 LAB — FANA STAINING PATTERNS
Homogeneous Pattern: 1:320 {titer} — ABNORMAL HIGH
Speckled Pattern: 1:320 {titer} — ABNORMAL HIGH

## 2016-01-10 LAB — BETA-2-GLYCOPROTEIN I ABS, IGG/M/A: Beta-2-Glycoprotein I IgA: <9 GPI IgA units (ref 0–25)

## 2016-01-11 LAB — HOMOCYSTEINE: HOMOCYSTEINE-NORM: 21.9 umol/L — AB (ref 0.0–15.0)

## 2016-01-11 LAB — CARDIOLIPIN ANTIBODIES, IGG, IGM, IGA: Anticardiolipin IgM: <9 MPL U/mL (ref 0–12)

## 2016-04-20 ENCOUNTER — Emergency Department (HOSPITAL_BASED_OUTPATIENT_CLINIC_OR_DEPARTMENT_OTHER)
Admission: EM | Admit: 2016-04-20 | Discharge: 2016-04-20 | Disposition: A | Discharge status: Home / Self Care | Payer: Medicare Other | Attending: Emergency Medicine | Admitting: Emergency Medicine

## 2016-04-20 ENCOUNTER — Encounter (HOSPITAL_BASED_OUTPATIENT_CLINIC_OR_DEPARTMENT_OTHER): Payer: Self-pay | Admitting: *Deleted

## 2016-04-20 DIAGNOSIS — M545 Low back pain: Secondary | ICD-10-CM | POA: Diagnosis present

## 2016-04-20 DIAGNOSIS — E119 Type 2 diabetes mellitus without complications: Secondary | ICD-10-CM | POA: Insufficient documentation

## 2016-04-20 DIAGNOSIS — Z794 Long term (current) use of insulin: Secondary | ICD-10-CM | POA: Insufficient documentation

## 2016-04-20 DIAGNOSIS — M5442 Lumbago with sciatica, left side: Secondary | ICD-10-CM | POA: Diagnosis not present

## 2016-04-20 DIAGNOSIS — Z79899 Other long term (current) drug therapy: Secondary | ICD-10-CM | POA: Insufficient documentation

## 2016-04-20 DIAGNOSIS — M5432 Sciatica, left side: Secondary | ICD-10-CM

## 2016-04-20 DIAGNOSIS — I10 Essential (primary) hypertension: Secondary | ICD-10-CM | POA: Diagnosis not present

## 2016-04-20 DIAGNOSIS — Z7982 Long term (current) use of aspirin: Secondary | ICD-10-CM | POA: Diagnosis not present

## 2016-04-20 DIAGNOSIS — Z87891 Personal history of nicotine dependence: Secondary | ICD-10-CM | POA: Insufficient documentation

## 2016-04-20 HISTORY — DX: Sciatica, unspecified side: M54.30

## 2016-04-20 LAB — URINALYSIS, MICROSCOPIC (REFLEX)

## 2016-04-20 LAB — URINALYSIS, ROUTINE W REFLEX MICROSCOPIC
BILIRUBIN URINE: NEGATIVE
GLUCOSE, UA: NEGATIVE mg/dL
KETONES UR: NEGATIVE mg/dL
LEUKOCYTES UA: NEGATIVE
Nitrite: NEGATIVE
PROTEIN: NEGATIVE mg/dL
Specific Gravity, Urine: 1.025 (ref 1.005–1.030)
pH: 5.5 (ref 5.0–8.0)

## 2016-04-20 MED ORDER — NAPROXEN 375 MG PO TABS: 375.0000 mg | ORAL_TABLET | Freq: Two times a day (BID) | ORAL | 0 refills | Status: AC

## 2016-04-20 MED ORDER — ORPHENADRINE CITRATE ER 100 MG PO TB12: 100.0000 mg | ORAL_TABLET | Freq: Two times a day (BID) | ORAL | 0 refills | Status: AC

## 2016-04-20 NOTE — ED Triage Notes (Signed)
Abdominal and back pain for a long time per pt. States the pain is getting worse with time.

## 2016-04-20 NOTE — ED Provider Notes (Signed)
[facility] Provider Note   CSN: [id number] Arrival date & time: 04/20/16  1543   By signing my name below, I, Talbert NanPaul Grant, attest that this documentation has been prepared under the direction and in the presence of Arby BarretteMarcy Devony Mcgrady, MD. Electronically Signed: Talbert NanPaul Grant, Scribe. 04/20/16. 6:29 PM.    History   Chief Complaint Chief Complaint  Patient presents with  . Abdominal Pain    HPI Anna Lester is a 66 y.o. female with h/o sciatica, DM, HTN who presents to the Emergency Department complaining of chronic, moderate, constant left lower back pain that began May 2017. She states that her pain is from sciatica. Pt has associated abdominal pain, nausea, SOB. Pt was given epidural 9/17 for the pain. Pt went to Neurologist and was given shots for pain 04/12/16. Pt is waiting to hear back from surgeon. Pt has been taking extra strength tylenol and aleve without relief. The aleve makes her uncomfortable. Pt has not been prescribed pain medicine for her sciatica before. Pt says that she does not like sleeping and so has not liked to take her pain medicine. Pt has not been recording her BGL due to losing her monitor. Pt is allergic to Ace inhibitors; Amlodipine; Codeine; Isosorbide nitrate; Metformin and related; Tramadol; and Nexium [esomeprazole]. Pt denies coughing.  The history is provided by the patient. No language interpreter was used.    Past Medical History:  Diagnosis Date  . Chest pain   . Diabetes mellitus, type 2 (HCC)   . Essential hypertension   . Irritable bowel syndrome 2017  . Lupus   . Palpitations   . S/P radiofrequency ablation operation for arrhythmia   . Sciatica   . SLE (systemic lupus erythematosus) (HCC)   . SVT (supraventricular tachycardia) (HCC)   . Thyroid disease    enlarged thyroid, followed by PCP    Patient Active Problem List   Diagnosis Date Noted  . TIA (transient ischemic attack) 01/06/2016  . Sciatica of left side 01/06/2016  .  AKI (acute kidney injury) (HCC) 01/06/2016  . Complicated migraine   . Abnormal EKG 04/26/2014  . Chest pain 04/26/2014  . SVT (supraventricular tachycardia) (HCC)   . SLE (systemic lupus erythematosus) (HCC)   . Essential hypertension   . Diabetes mellitus, type 2 (HCC)   . S/P radiofrequency ablation operation for arrhythmia     Past Surgical History:  Procedure Laterality Date  . ABDOMINAL HYSTERECTOMY    . APPENDECTOMY    . ESOPHAGEAL DILATION    . HERNIA REPAIR      OB History    No data available       Home Medications    Prior to Admission medications   Medication Sig Start Date End Date Taking? Authorizing Provider  ammonium lactate (LAC-HYDRIN) 12 % lotion Apply 1 application topically 2 (two) times daily as needed for dry skin. Apply to both feet Indications: Abnormal Dryness of Skin, Eyes or Mucous Membranes 03/04/13   Historical Provider, MD  aspirin 81 MG EC tablet Take 81 mg by mouth daily.  11/20/12   Historical Provider, MD  atorvastatin (LIPITOR) 40 MG tablet Take 40 mg by mouth daily at 6 PM.  02/13/13   Historical Provider, MD  chlorthalidone (HYGROTON) 25 MG tablet Take 25 mg by mouth daily.    Historical Provider, MD  HYDROcodone-acetaminophen (NORCO/VICODIN) 5-325 MG per tablet Take 1 tablet by mouth every 6 (six) hours as needed for moderate pain or severe pain. 10/11/14  Lavera Guise, MD  insulin glargine (LANTUS) 100 unit/mL SOPN Inject 30 Units into the skin at bedtime.    Historical Provider, MD  metoprolol (LOPRESSOR) 100 MG tablet Take 1 tablet (100 mg total) by mouth 2 (two) times daily. 04/27/14   Zannie Cove, MD  naproxen (NAPROSYN) 375 MG tablet Take 1 tablet (375 mg total) by mouth 2 (two) times daily. 04/20/16   Arby Barrette, MD  omeprazole (PRILOSEC) 40 MG capsule Take 40 mg by mouth daily.  03/28/13   Historical Provider, MD  ondansetron (ZOFRAN ODT) 4 MG disintegrating tablet Take 1 tablet (4 mg total) by mouth every 8 (eight) hours as needed  for nausea or vomiting. 06/25/14   Everlene Farrier, PA-C  orphenadrine (NORFLEX) 100 MG tablet Take 1 tablet (100 mg total) by mouth 2 (two) times daily. 04/20/16   Arby Barrette, MD  potassium chloride SA (K-DUR,KLOR-CON) 20 MEQ tablet Take 20 mEq by mouth daily.    Historical Provider, MD  predniSONE (DELTASONE) 10 MG tablet Take 10 mg by mouth daily with breakfast.    Historical Provider, MD  Travoprost, BAK Free, (TRAVATAN Z) 0.004 % SOLN ophthalmic solution Place 1 drop into both eyes at bedtime.    Historical Provider, MD    Family History Family History  Problem Relation Age of Onset  . Colon cancer Mother   . Leukemia Sister     Social History Social History  Substance Use Topics  . Smoking status: Former Smoker    Types: Cigarettes  . Smokeless tobacco: Never Used  . Alcohol use No     Allergies   Ace inhibitors; Amlodipine; Codeine; Isosorbide nitrate; Metformin and related; Tramadol; and Nexium [esomeprazole]   Review of Systems Review of Systems  A complete 10 system review of systems was obtained and all systems are negative except as noted in the HPI and PMH.    Physical Exam Updated Vital Signs BP (!) 171/81 (BP Location: Left Arm)   Pulse 76   Temp 98.4 F (36.9 C) (Oral)   Resp 20   Ht 5\' 4"  (1.626 m)   Wt 210 lb (95.3 kg)   SpO2 98%   BMI 36.05 kg/m   Physical Exam  Constitutional: She is oriented to person, place, and time. She appears well-developed and well-nourished.  HENT:  Head: Normocephalic and atraumatic.  Right Ear: External ear normal.  Left Ear: External ear normal.  Mouth/Throat: Oropharynx is clear and moist.  Eyes: Conjunctivae and EOM are normal. Pupils are equal, round, and reactive to light. No scleral icterus.  Cardiovascular: Normal rate, regular rhythm and normal heart sounds.   Pulmonary/Chest: Effort normal and breath sounds normal. She has no wheezes.  Musculoskeletal:  Some reproducible pain over the SI joint. Positive  straight leg raise at 10 degrees.  Neurological: She is alert and oriented to person, place, and time.  Skin: Skin is warm and dry.  Psychiatric: She has a normal mood and affect.  Nursing note and vitals reviewed.    ED Treatments / Results   DIAGNOSTIC STUDIES: Oxygen Saturation is 98% on room air, normal by my interpretation.    COORDINATION OF CARE: 6:26 PM Discussed treatment plan with pt at bedside and pt agreed to plan, which includes antiinflammatory medication, and following up with her neurologist and surgeon.  Labs (all labs ordered are listed, but only abnormal results are displayed) Labs Reviewed  URINALYSIS, ROUTINE W REFLEX MICROSCOPIC - Abnormal; Notable for the following:  Result Value   Hgb urine dipstick SMALL (*)    All other components within normal limits  URINALYSIS, MICROSCOPIC (REFLEX) - Abnormal; Notable for the following:    Bacteria, UA RARE (*)    Squamous Epithelial / LPF 0-5 (*)    All other components within normal limits    EKG  EKG Interpretation None       Radiology No results found.  Procedures Procedures (including critical care time)  Medications Ordered in ED Medications - No data to display   Initial Impression / Assessment and Plan / ED Course  I have reviewed the triage vital signs and the nursing notes.  Pertinent labs & imaging results that were available during my care of the patient were reviewed by me and considered in my medical decision making (see chart for details).     Final Clinical Impressions(s) / ED Diagnoses   Final diagnoses:  Sciatica of left side   Patient describes symptoms consistent with long-standing sciatica. There does not appear to be any acute change today. Patient has not gotten much relief from several different treatment strategies. Her abdomen is soft and nontender. Urinalysis is negative for infection. I have very low suspicion for other active problems at this time. Patient is  functioning at baseline with no acute neurologic dysfunction. I will have her try naproxen and Norflex for pain relief and continue to work with her outpatient providers. New Prescriptions Discharge Medication List as of 04/20/2016  6:31 PM    START taking these medications   Details  naproxen (NAPROSYN) 375 MG tablet Take 1 tablet (375 mg total) by mouth 2 (two) times daily., Starting Thu 04/20/2016, Print    orphenadrine (NORFLEX) 100 MG tablet Take 1 tablet (100 mg total) by mouth 2 (two) times daily., Starting Thu 04/20/2016, Print         Arby Barrette, MD 04/25/16 [id number]

## 2016-04-20 NOTE — ED Notes (Signed)
Pt reports she is having pain radiating from her lower left back to left hip, left groin, and left leg. Pt also complains of left lower abd pain and a knot in her left AC.

## 2016-12-03 IMAGING — DX DG TIBIA/FIBULA 2V*L*
4 series · 4 of 4 positions shown · non-contrast
Comparison: None.

ADDENDUM:
Transverse cortical discontinuity at the head of the fibula is noted
which could represent fracture given the history of trauma but is
suboptimally evaluated. Correlate for point tenderness to this area.

Addendum by Dr. Maikel Askar on 10/01/2014 at [DATE] p.m..
CLINICAL DATA: Fall, left lower extremity pain
EXAM:
LEFT TIBIA AND FIBULA - 2 VIEW

[tibia ap (1 of 2)]
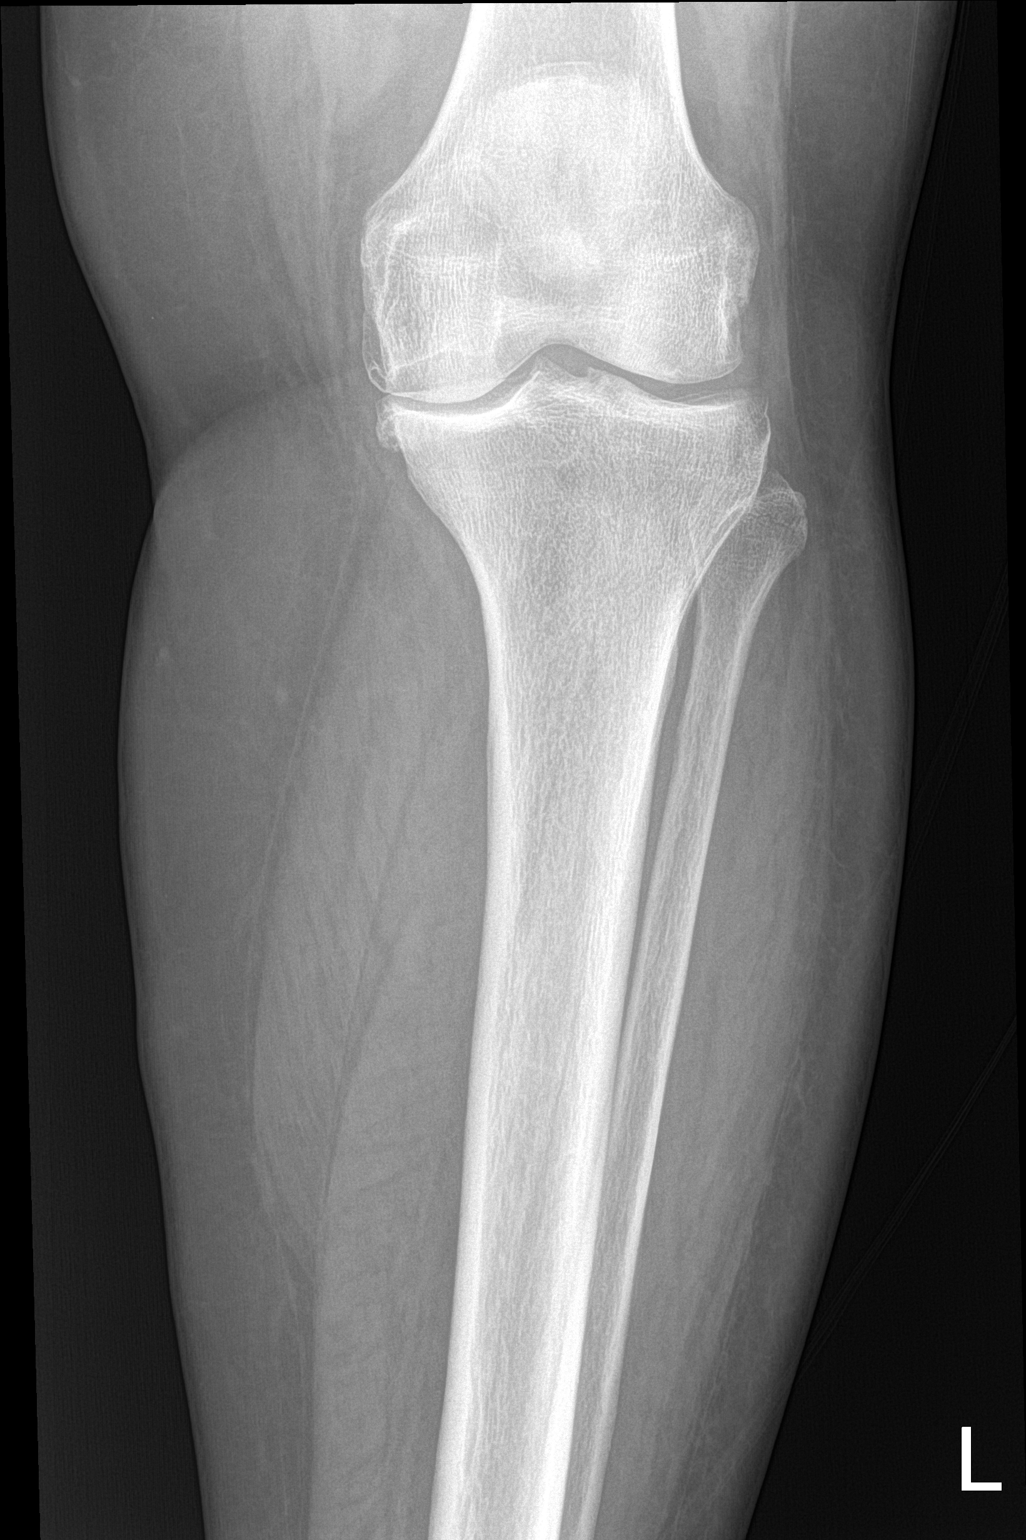

[tibia ap (2 of 2)]
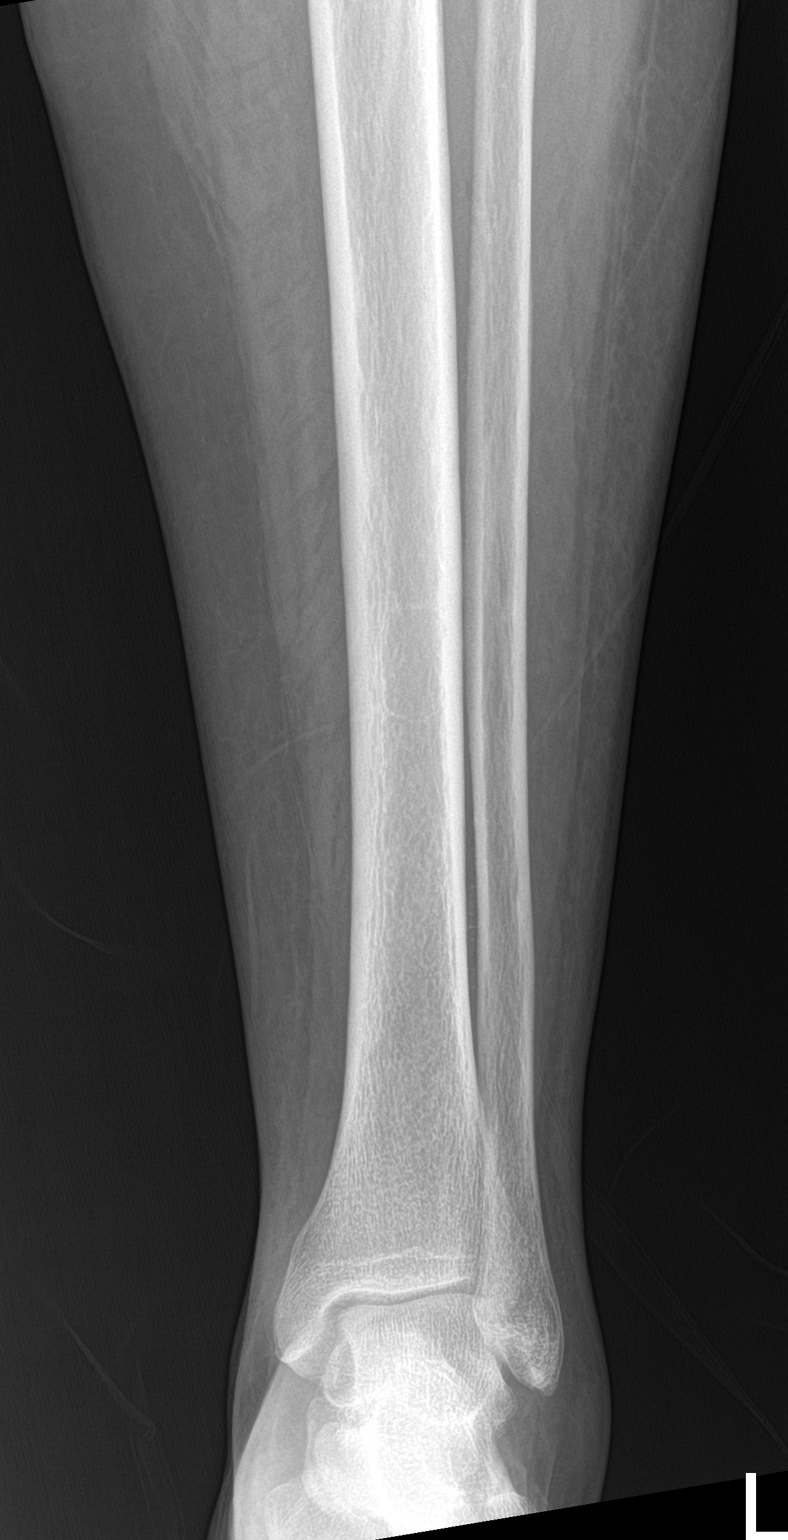

[tibia lat (1 of 2)]
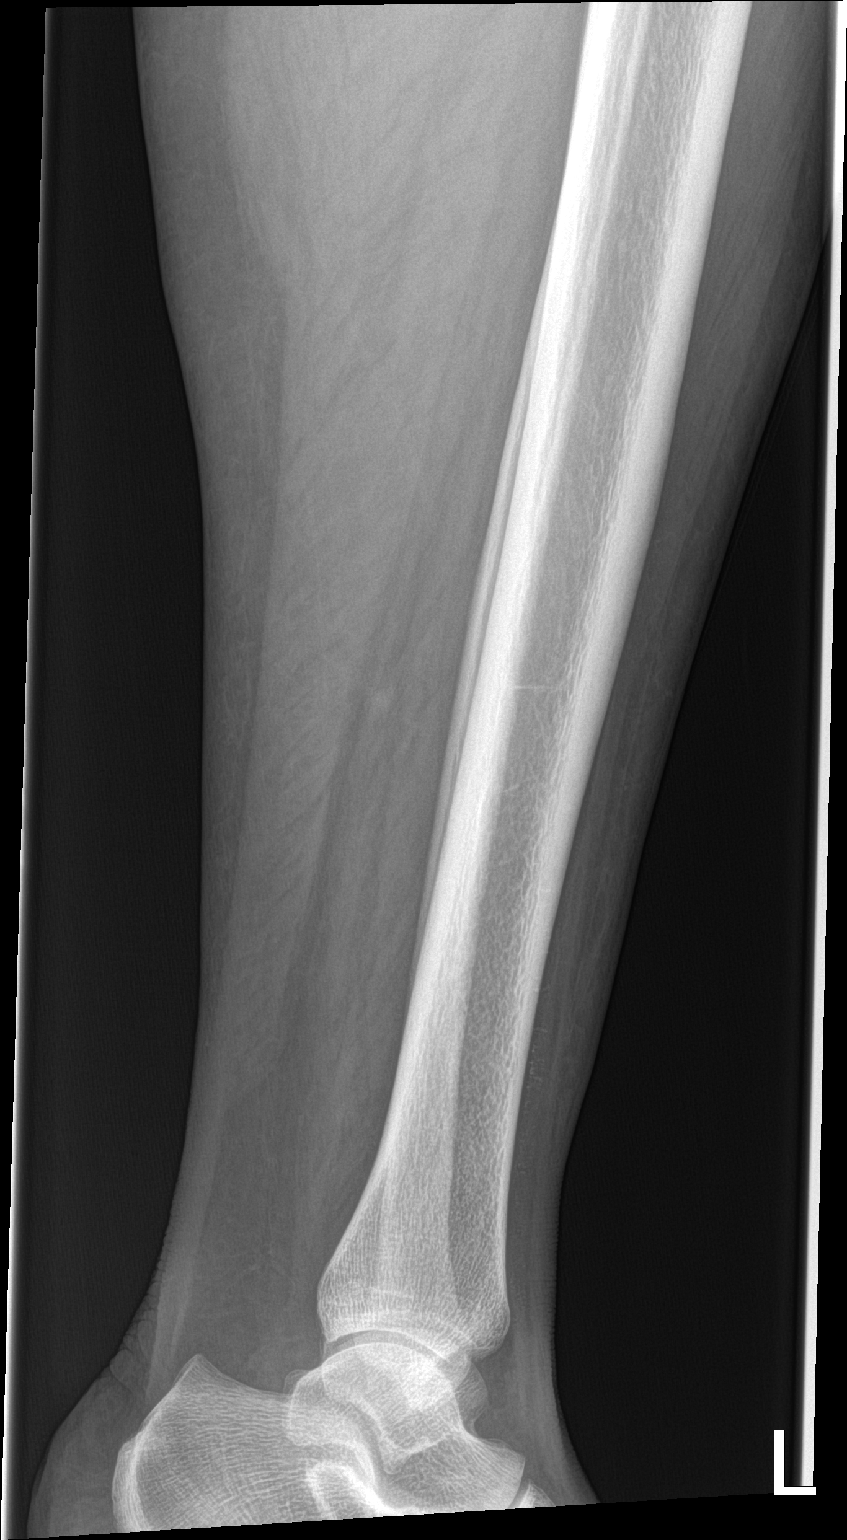

[tibia lat (2 of 2)]
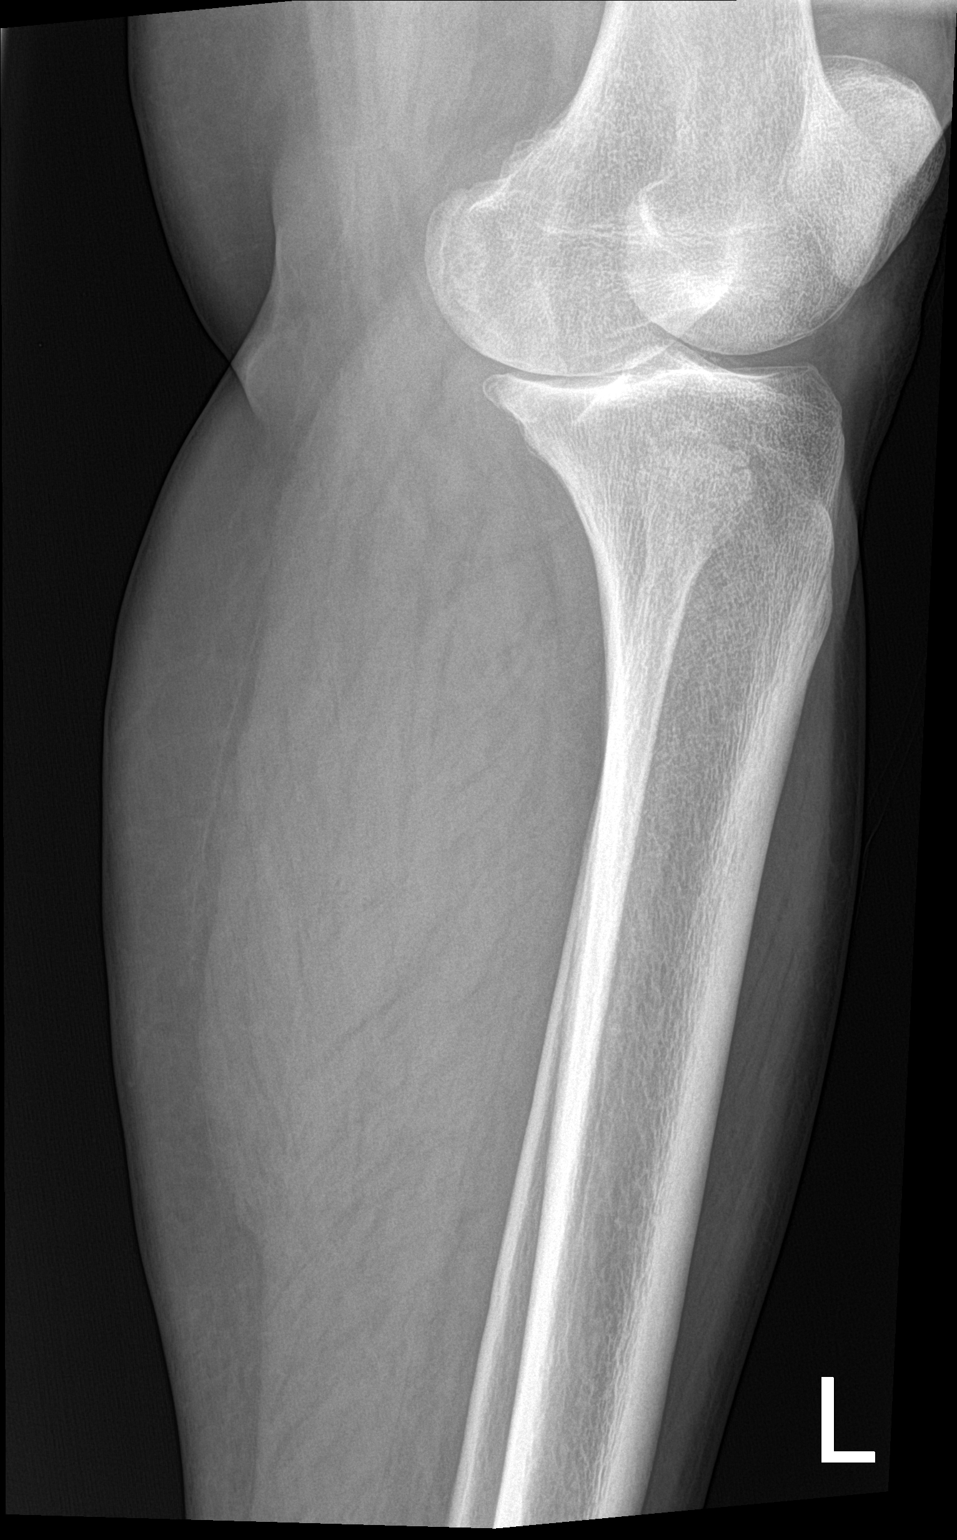

[4 of 4 positions shown; findings below may reference images not displayed]

FINDINGS: There is no evidence of fracture or other focal bone lesions. Soft
tissues are unremarkable. Mild tricompartmental degenerative change
in the knee noted, partly visualized.
IMPRESSION: Negative.

## 2016-12-03 IMAGING — DX DG ANKLE COMPLETE 3+V*L*
3 series · 3 of 3 positions shown · non-contrast
Comparison: None.

CLINICAL DATA: Patient status post fall with left ankle pain and
injury. Patient unable to bear weight.

EXAM:
LEFT ANKLE COMPLETE - 3+ VIEW

[ankle ap]
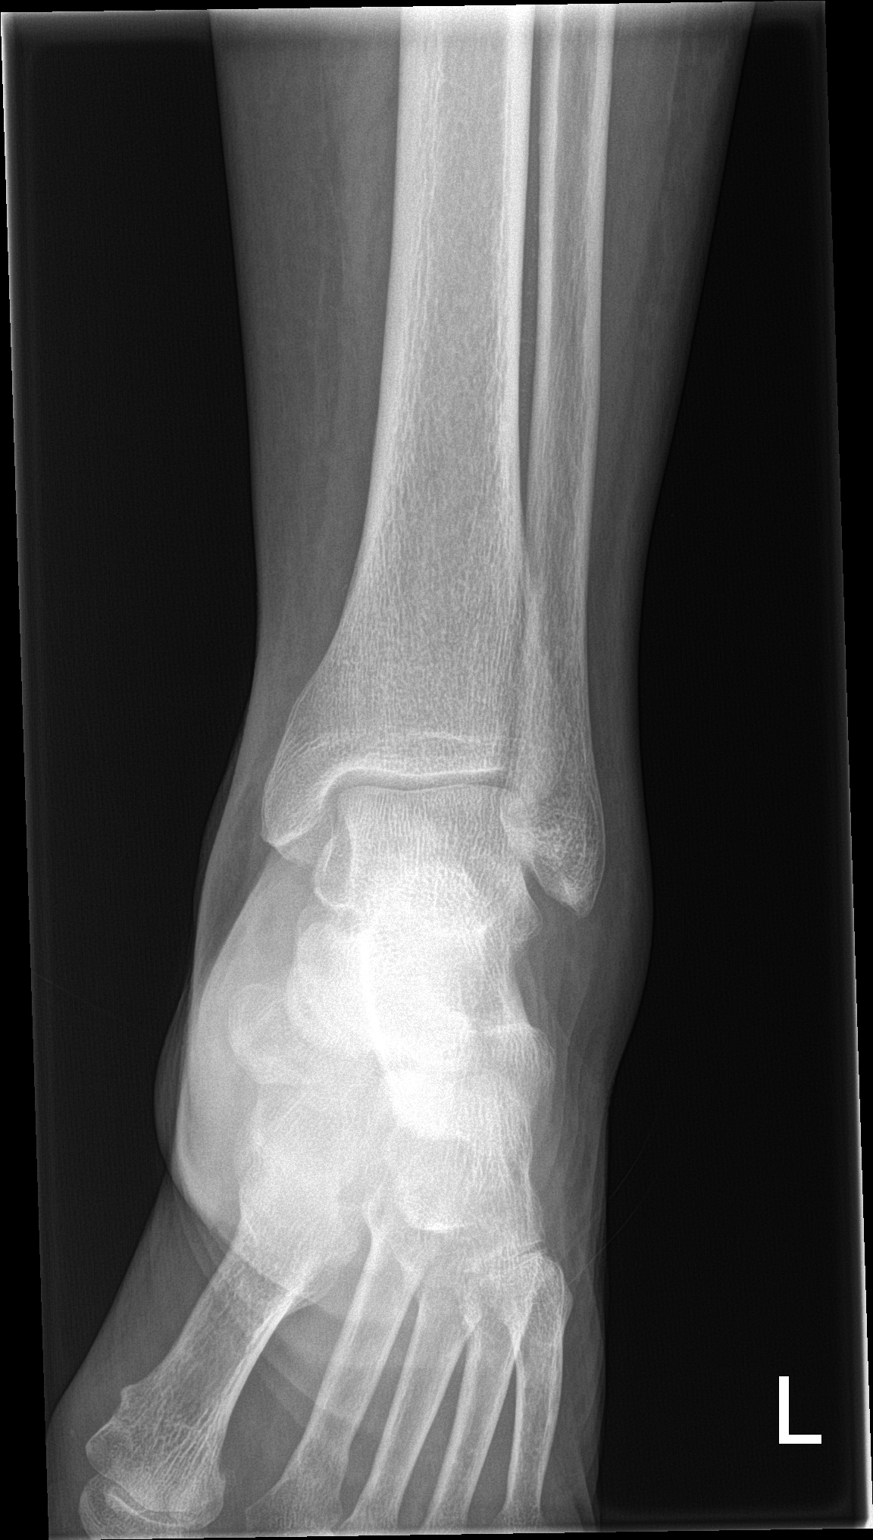

[ankle obl]
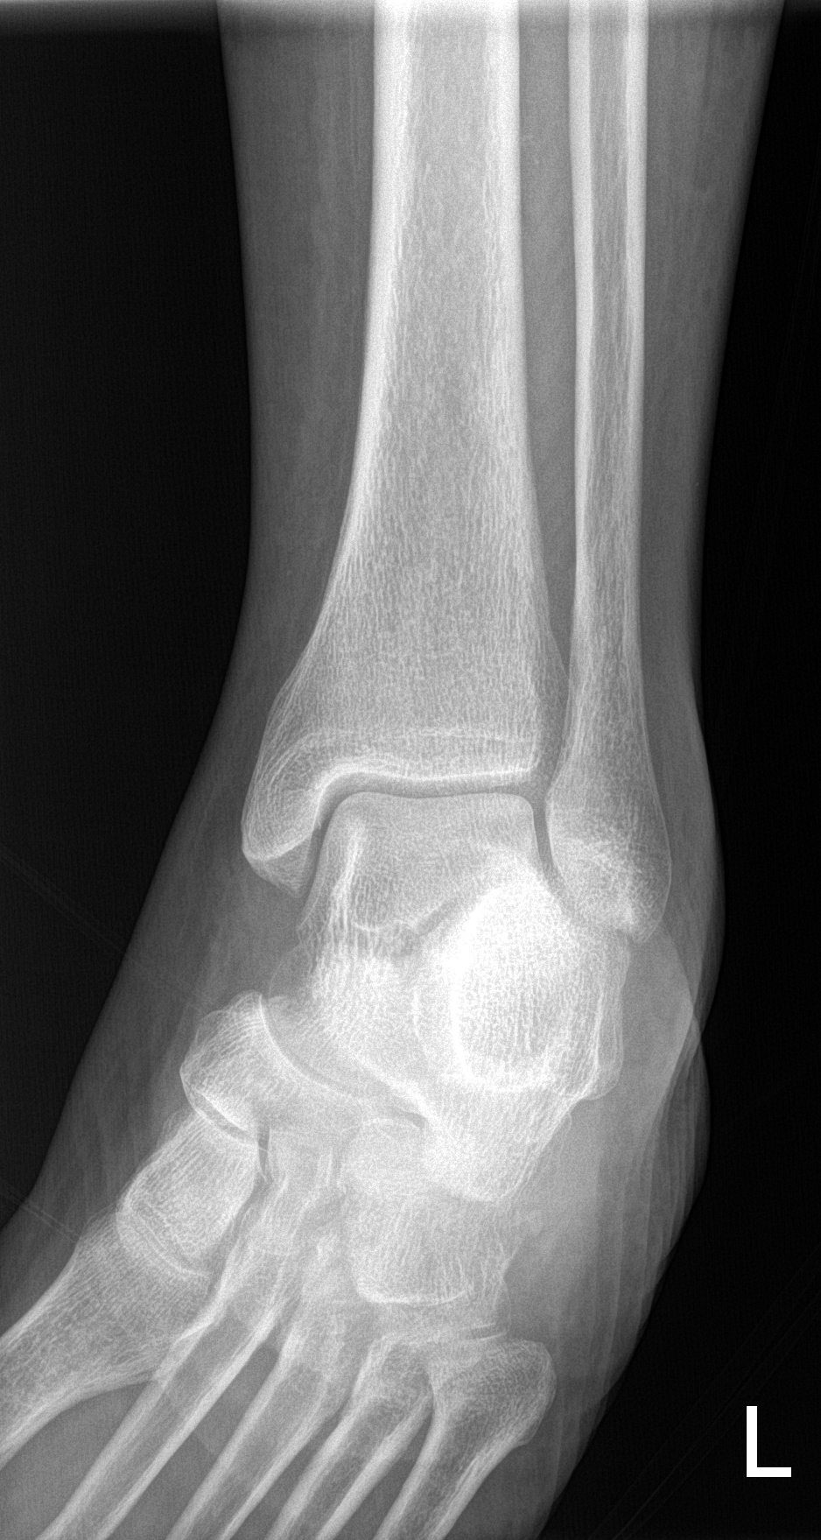

[ankle lat]
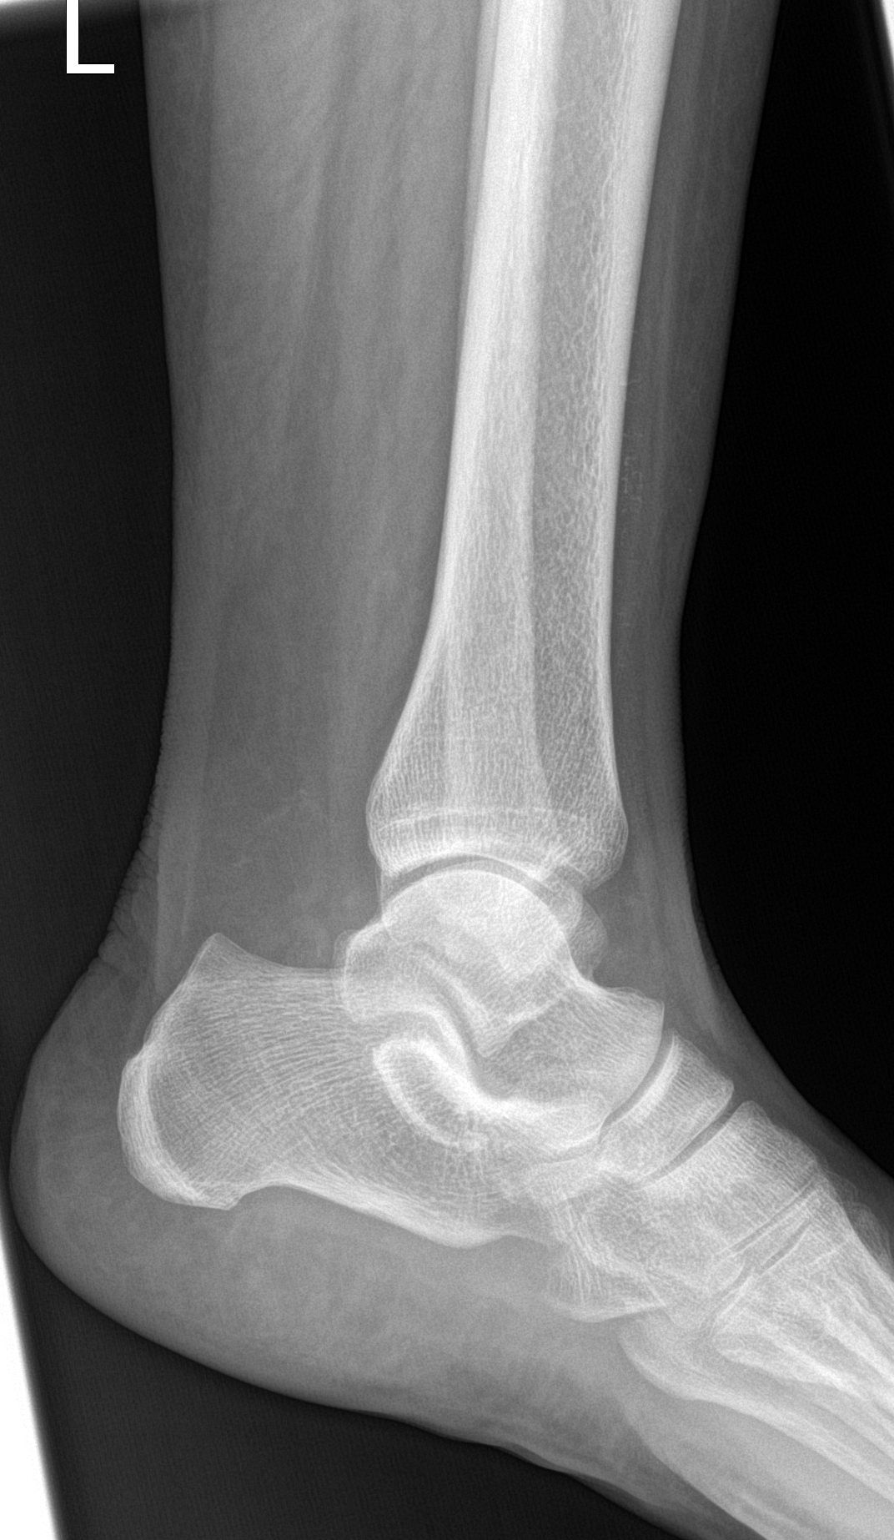

[3 of 3 positions shown; findings below may reference images not displayed]

FINDINGS: Normal anatomic alignment. No evidence for acute fracture or
dislocation. Talar dome is intact. Vascular calcifications.
IMPRESSION: No acute osseous abnormality.

## 2017-06-25 ENCOUNTER — Other Ambulatory Visit: Payer: Self-pay

## 2017-06-25 ENCOUNTER — Emergency Department (HOSPITAL_BASED_OUTPATIENT_CLINIC_OR_DEPARTMENT_OTHER): Payer: Medicare Other

## 2017-06-25 ENCOUNTER — Encounter (HOSPITAL_BASED_OUTPATIENT_CLINIC_OR_DEPARTMENT_OTHER): Payer: Self-pay | Admitting: *Deleted

## 2017-06-25 ENCOUNTER — Emergency Department (HOSPITAL_BASED_OUTPATIENT_CLINIC_OR_DEPARTMENT_OTHER)
Admission: EM | Admit: 2017-06-25 | Discharge: 2017-06-25 | Discharge status: Against Medical Advice | Payer: Medicare Other | Attending: Emergency Medicine | Admitting: Emergency Medicine

## 2017-06-25 DIAGNOSIS — E119 Type 2 diabetes mellitus without complications: Secondary | ICD-10-CM | POA: Diagnosis not present

## 2017-06-25 DIAGNOSIS — Z87891 Personal history of nicotine dependence: Secondary | ICD-10-CM | POA: Insufficient documentation

## 2017-06-25 DIAGNOSIS — R42 Dizziness and giddiness: Secondary | ICD-10-CM

## 2017-06-25 DIAGNOSIS — Z794 Long term (current) use of insulin: Secondary | ICD-10-CM | POA: Insufficient documentation

## 2017-06-25 DIAGNOSIS — Z79899 Other long term (current) drug therapy: Secondary | ICD-10-CM | POA: Insufficient documentation

## 2017-06-25 DIAGNOSIS — Z7982 Long term (current) use of aspirin: Secondary | ICD-10-CM | POA: Diagnosis not present

## 2017-06-25 DIAGNOSIS — E876 Hypokalemia: Secondary | ICD-10-CM | POA: Diagnosis not present

## 2017-06-25 DIAGNOSIS — I10 Essential (primary) hypertension: Secondary | ICD-10-CM | POA: Diagnosis not present

## 2017-06-25 LAB — CBC WITH DIFFERENTIAL/PLATELET
BASOS PCT: 0 %
Basophils Absolute: 0 10*3/uL (ref 0.0–0.1)
EOS ABS: 0.1 10*3/uL (ref 0.0–0.7)
EOS PCT: 1 %
HCT: 35.6 % — ABNORMAL LOW (ref 36.0–46.0)
HEMOGLOBIN: 11.6 g/dL — AB (ref 12.0–15.0)
Lymphocytes Relative: 19 %
Lymphs Abs: 1.9 10*3/uL (ref 0.7–4.0)
MCH: 26.9 pg (ref 26.0–34.0)
MCHC: 32.6 g/dL (ref 30.0–36.0)
MCV: 82.4 fL (ref 78.0–100.0)
Monocytes Absolute: 0.6 10*3/uL (ref 0.1–1.0)
Monocytes Relative: 6 %
NEUTROS PCT: 74 %
Neutro Abs: 7.2 10*3/uL (ref 1.7–7.7)
PLATELETS: 211 10*3/uL (ref 150–400)
RBC: 4.32 MIL/uL (ref 3.87–5.11)
RDW: 17 % — ABNORMAL HIGH (ref 11.5–15.5)
WBC: 9.8 10*3/uL (ref 4.0–10.5)

## 2017-06-25 LAB — URINALYSIS, ROUTINE W REFLEX MICROSCOPIC
BILIRUBIN URINE: NEGATIVE
Glucose, UA: NEGATIVE mg/dL
Ketones, ur: NEGATIVE mg/dL
NITRITE: NEGATIVE
PROTEIN: NEGATIVE mg/dL
SPECIFIC GRAVITY, URINE: 1.015 (ref 1.005–1.030)
pH: 7.5 (ref 5.0–8.0)

## 2017-06-25 LAB — URINALYSIS, MICROSCOPIC (REFLEX)

## 2017-06-25 LAB — BASIC METABOLIC PANEL
ANION GAP: 12 (ref 5–15)
BUN: 13 mg/dL (ref 6–20)
CO2: 26 mmol/L (ref 22–32)
Calcium: 9.1 mg/dL (ref 8.9–10.3)
Chloride: 103 mmol/L (ref 101–111)
Creatinine, Ser: 0.73 mg/dL (ref 0.44–1.00)
GFR calc Af Amer: >60 mL/min (ref >60)
GFR calc non Af Amer: >60 mL/min (ref >60)
GLUCOSE: 117 mg/dL — AB (ref 65–99)
POTASSIUM: 2.6 mmol/L — AB (ref 3.5–5.1)
Sodium: 141 mmol/L (ref 135–145)

## 2017-06-25 LAB — MAGNESIUM: Magnesium: 1.9 mg/dL (ref 1.7–2.4)

## 2017-06-25 MED ORDER — POTASSIUM CHLORIDE CRYS ER 20 MEQ PO TBCR
40.0000 meq | EXTENDED_RELEASE_TABLET | Freq: Once | ORAL | Status: AC
  Administered 2017-06-25: 40 meq via ORAL
  Filled 2017-06-25: qty 2

## 2017-06-25 MED ORDER — SODIUM CHLORIDE 0.9 % IV BOLUS: 500.0000 mL | Freq: Once | INTRAVENOUS | Status: DC

## 2017-06-25 MED ORDER — MECLIZINE HCL 25 MG PO TABS
25.0000 mg | ORAL_TABLET | Freq: Once | ORAL | Status: AC
  Administered 2017-06-25: 25 mg via ORAL
  Filled 2017-06-25: qty 1

## 2017-06-25 MED ORDER — DIAZEPAM 5 MG PO TABS
5.0000 mg | ORAL_TABLET | Freq: Once | ORAL | Status: AC
  Administered 2017-06-25: 5 mg via ORAL
  Filled 2017-06-25: qty 1

## 2017-06-25 MED ORDER — POTASSIUM CHLORIDE 10 MEQ/100ML IV SOLN: 10.0000 meq | INTRAVENOUS | Status: AC

## 2017-06-25 MED ORDER — HYDROMORPHONE HCL 1 MG/ML IJ SOLN: 1.0000 mg | Freq: Once | INTRAMUSCULAR | Status: DC

## 2017-06-25 MED ORDER — POTASSIUM CHLORIDE CRYS ER 20 MEQ PO TBCR: 20.0000 meq | EXTENDED_RELEASE_TABLET | Freq: Every day | ORAL | 0 refills | Status: AC

## 2017-06-25 NOTE — ED Notes (Signed)
Attempted IV x 2 unsuccessful, to R wrist and left hand , tol well

## 2017-06-25 NOTE — ED Provider Notes (Signed)
[facility] EMERGENCY DEPARTMENT Provider Note   CSN: [id number] Arrival date & time: 06/25/17  1049     History   Chief Complaint Chief Complaint  Patient presents with  . Dizziness    HPI Anna Lester is a 67 y.o. female.  Patient is a 67 year old female with a history of diabetes, hypertension, and lupus who presents with dizziness.  She states that 2 days ago she had onset of dizziness which she describes as her typical vertigo.  She states it happened when she laid her head back on a chair.  She had a sudden onset of spinning sensation.  This is been associated with nausea and vomiting.  She feels a little off balance when she is walking but she states that that is not unusual for her since she has had recent back surgery several months ago.  She has some chronic weakness in her extremities related to her back surgery but she denies any change in the symptoms.  No new numbness or weakness.  No speech deficits.  No vision changes.  No fevers.  No recent head trauma.  She had a prior history of vertigo and says that this episode feels the same as when it did back then.  She has been taking her meclizine without improvement in symptoms.  She denies any chest pain or shortness of breath.  No fevers, cough, congestion or other recent illnesses.     Past Medical History:  Diagnosis Date  . Chest pain   . Diabetes mellitus, type 2 (HCC)   . Essential hypertension   . Irritable bowel syndrome 2017  . Lupus (HCC)   . Palpitations   . S/P radiofrequency ablation operation for arrhythmia   . Sciatica   . SLE (systemic lupus erythematosus) (HCC)   . SVT (supraventricular tachycardia) (HCC)   . Thyroid disease    enlarged thyroid, followed by PCP    Patient Active Problem List   Diagnosis Date Noted  . TIA (transient ischemic attack) 01/06/2016  . Sciatica of left side 01/06/2016  . AKI (acute kidney injury) (HCC) 01/06/2016  . Complicated migraine   . Abnormal EKG  04/26/2014  . Chest pain 04/26/2014  . SVT (supraventricular tachycardia) (HCC)   . SLE (systemic lupus erythematosus) (HCC)   . Essential hypertension   . Diabetes mellitus, type 2 (HCC)   . S/P radiofrequency ablation operation for arrhythmia     Past Surgical History:  Procedure Laterality Date  . ABDOMINAL HYSTERECTOMY    . APPENDECTOMY    . ESOPHAGEAL DILATION    . HERNIA REPAIR       OB History   None      Home Medications    Prior to Admission medications   Medication Sig Start Date End Date Taking? Authorizing Provider  ammonium lactate (LAC-HYDRIN) 12 % lotion Apply 1 application topically 2 (two) times daily as needed for dry skin. Apply to both feet Indications: Abnormal Dryness of Skin, Eyes or Mucous Membranes 03/04/13   [provider]  aspirin 81 MG EC tablet Take 81 mg by mouth daily.  11/20/12   [provider]  atorvastatin (LIPITOR) 40 MG tablet Take 40 mg by mouth daily at 6 PM.  02/13/13   [provider]  chlorthalidone (HYGROTON) 25 MG tablet Take 25 mg by mouth daily.    [provider]  HYDROcodone-acetaminophen (NORCO/VICODIN) 5-325 MG per tablet Take 1 tablet by mouth every 6 (six) hours as needed for moderate pain or  severe pain. 10/11/14   Lavera Guise, MD  insulin glargine (LANTUS) 100 unit/mL SOPN Inject 30 Units into the skin at bedtime.    [provider]  metoprolol (LOPRESSOR) 100 MG tablet Take 1 tablet (100 mg total) by mouth 2 (two) times daily. 04/27/14   Zannie Cove, MD  naproxen (NAPROSYN) 375 MG tablet Take 1 tablet (375 mg total) by mouth 2 (two) times daily. 04/20/16   Arby Barrette, MD  omeprazole (PRILOSEC) 40 MG capsule Take 40 mg by mouth daily.  03/28/13   [provider]  ondansetron (ZOFRAN ODT) 4 MG disintegrating tablet Take 1 tablet (4 mg total) by mouth every 8 (eight) hours as needed for nausea or vomiting. 06/25/14   Everlene Farrier, PA-C  orphenadrine (NORFLEX) 100 MG  tablet Take 1 tablet (100 mg total) by mouth 2 (two) times daily. 04/20/16   Arby Barrette, MD  potassium chloride SA (K-DUR,KLOR-CON) 20 MEQ tablet Take 20 mEq by mouth daily.    [provider]  potassium chloride SA (K-DUR,KLOR-CON) 20 MEQ tablet Take 1 tablet (20 mEq total) by mouth daily. 06/25/17   Rolan Bucco, MD  predniSONE (DELTASONE) 10 MG tablet Take 10 mg by mouth daily with breakfast.    [provider]  Travoprost, BAK Free, (TRAVATAN Z) 0.004 % SOLN ophthalmic solution Place 1 drop into both eyes at bedtime.    [provider]    Family History Family History  Problem Relation Age of Onset  . Colon cancer Mother   . Leukemia Sister     Social History Social History   Tobacco Use  . Smoking status: Former Smoker    Types: Cigarettes  . Smokeless tobacco: Never Used  Substance Use Topics  . Alcohol use: No  . Drug use: No     Allergies   Ace inhibitors; Amlodipine; Codeine; Isosorbide nitrate; Metformin and related; Tramadol; and Nexium [esomeprazole]   Review of Systems Review of Systems  Constitutional: Negative for chills, diaphoresis, fatigue and fever.  HENT: Negative for congestion, rhinorrhea and sneezing.   Eyes: Negative.   Respiratory: Negative for cough, chest tightness and shortness of breath.   Cardiovascular: Negative for chest pain and leg swelling.  Gastrointestinal: Positive for nausea and vomiting. Negative for abdominal pain, blood in stool and diarrhea.  Genitourinary: Negative for difficulty urinating, flank pain, frequency and hematuria.  Musculoskeletal: Negative for arthralgias and back pain.  Skin: Negative for rash.  Neurological: Positive for dizziness. Negative for speech difficulty, weakness, numbness and headaches.     Physical Exam Updated Vital Signs BP 124/62 (BP Location: Left Arm)   Pulse 73   Temp 98.3 F (36.8 C)   Resp 18   Ht  (1.626 m)   Wt 94.8 kg (209 lb)   SpO2 96%   BMI  35.87 kg/m   Physical Exam  Constitutional: She is oriented to person, place, and time. She appears well-developed and well-nourished.  HENT:  Head: Normocephalic and atraumatic.  Eyes: Pupils are equal, round, and reactive to light.  No nystagmus  Neck: Normal range of motion. Neck supple.  Cardiovascular: Normal rate, regular rhythm and normal heart sounds.  Pulmonary/Chest: Effort normal and breath sounds normal. No respiratory distress. She has no wheezes. She has no rales. She exhibits no tenderness.  Abdominal: Soft. Bowel sounds are normal. There is no tenderness. There is no rebound and no guarding.  Musculoskeletal: Normal range of motion. She exhibits no edema.  Lymphadenopathy:    She  has no cervical adenopathy.  Neurological: She is alert and oriented to person, place, and time.  Motor 5/5 all extremities Sensation grossly intact to LT all extremities Finger to Nose intact, no pronator drift CN II-XII grossly intact    Skin: Skin is warm and dry. No rash noted.  Psychiatric: She has a normal mood and affect.     ED Treatments / Results  Labs (all labs ordered are listed, but only abnormal results are displayed) Labs Reviewed  BASIC METABOLIC PANEL - Abnormal; Notable for the following components:      Result Value   Potassium 2.6 (*)    Glucose, Bld 117 (*)    All other components within normal limits  CBC WITH DIFFERENTIAL/PLATELET - Abnormal; Notable for the following components:   Hemoglobin 11.6 (*)    HCT 35.6 (*)    RDW 17.0 (*)    All other components within normal limits  URINALYSIS, ROUTINE W REFLEX MICROSCOPIC - Abnormal; Notable for the following components:   Hgb urine dipstick TRACE (*)    Leukocytes, UA SMALL (*)    All other components within normal limits  URINALYSIS, MICROSCOPIC (REFLEX) - Abnormal; Notable for the following components:   Bacteria, UA RARE (*)    All other components within normal limits  MAGNESIUM    EKG EKG  Interpretation  Date/Time:  Monday Jun 25 2017 11:30:03 EDT Ventricular Rate:  71 PR Interval:    QRS Duration: 103 QT Interval:  405 QTC Calculation: 441 R Axis:   36 Text Interpretation:  Age not entered, assumed to be  67 years old for purpose of ECG interpretation Ectopic atrial rhythm Short PR interval Abnormal R-wave progression, early transition Repol abnrm, severe global ischemia (LM/MVD) since last tracing no significant change Confirmed by Rolan Bucco [phone]) on 06/25/2017 11:47:39 AM   Radiology Ct Head Wo Contrast  Result Date: 06/25/2017 CLINICAL DATA:  Vertigo, persistent symptoms despite medication EXAM: CT HEAD WITHOUT CONTRAST TECHNIQUE: Contiguous axial images were obtained from the base of the skull through the vertex without intravenous contrast. COMPARISON:  05/11/2017 FINDINGS: Brain: Mild parenchymal atrophy. Patchy areas of hypoattenuation in deep and periventricular white matter bilaterally. Negative for acute intracranial hemorrhage, mass lesion, acute infarction, midline shift, or mass-effect. Acute infarct may be inapparent on noncontrast CT. Ventricles and sulci symmetric. Vascular: No hyperdense vessel or unexpected calcification. Skull: Normal. Negative for fracture or focal lesion. Sinuses/Orbits: No acute finding. Other: None. IMPRESSION: 1. Negative for bleed or other acute intracranial process. 2. Stable mild atrophy and nonspecific white matter changes. Electronically Signed   By: Corlis Leak M.D.   On: 06/25/2017 13:24    Procedures Procedures (including critical care time)  Medications Ordered in ED Medications  sodium chloride 0.9 % bolus 500 mL (has no administration in time range)  potassium chloride 10 mEq in 100 mL IVPB (has no administration in time range)  diazepam (VALIUM) tablet 5 mg (5 mg Oral Given 06/25/17 1148)  meclizine (ANTIVERT) tablet 25 mg (25 mg Oral Given 06/25/17 1427)  potassium chloride SA (K-DUR,KLOR-CON) CR tablet 40 mEq (40  mEq Oral Given 06/25/17 1427)     Initial Impression / Assessment and Plan / ED Course  I have reviewed the triage vital signs and the nursing notes.  Pertinent labs & imaging results that were available during my care of the patient were reviewed by me and considered in my medical decision making (see chart for details).     Patient is a 67 year old  female who presents with vertigo type symptoms.  She has had no improvement with meclizine at home.  She was given a dose of Valium in the ED without improvement in symptoms.  She was also given a dose of meclizine following that.  Given that she was having no improvement in symptoms, I ordered labs and a CT scan of her head as well as an EKG.  CT scan does not show any acute abnormalities.  Her labs show a markedly low potassium at 2.6.  Her EKG is non-concerning.  She was given oral potassium replacement and IVs were attempted and were unsuccessful.  Nurses attempted IVs and I attempted an ultrasound IV 1 time which was unsuccessful.  I was able to see a vein on a repeat look but patient is refusing any more IV attempts.  We have multiple nurses talk to the patient regarding this and I spent a long time talking the patient regarding the importance of treatment.  She is adamantly refusing any further care.  She does not appear to be overly upset she just does not want to be admitted or have any further IV attempts.  I did talk to her about the seriousness of her hypokalemia and this could be life-threatening.  I also advised her that I would like to admit her overnight for further evaluation of the not improving vertigo symptoms.  She may need an MRI if her symptoms are not improving to rule out a posterior circulation stroke.  I advised her that the symptoms could indicate that she is having a stroke given that she has had no improvement.  She understands these risk and acknowledges them back to me.  She does acknowledge that could be life-threatening or  debilitating and she does not want to be admitted or have any further treatment in the ED.  She does have potassium on her medication list but she does not know if she has an active prescription for it.  I did give her a 3-day course of potassium and she states that she can follow-up with her doctor tomorrow.  She was advised to return the emergency department if she has any worsening symptoms or if she changes her mind about wanting treatment.  Final Clinical Impressions(s) / ED Diagnoses   Final diagnoses:  Dizziness  Hypokalemia    ED Discharge Orders        Ordered    potassium chloride SA (K-DUR,KLOR-CON) 20 MEQ tablet  Daily     06/25/17 1511       Rolan Bucco, MD 06/25/17 1515

## 2017-06-25 NOTE — ED Triage Notes (Signed)
Pt reports her typical vertigo symptoms onset Saturday, room spinning with position changes and nausea, has been taking her meclizine that her pcp prescribed, with no relief. Pt states that last time she had these symptoms they resolved with taking meclizine, but this time they persist.

## 2019-03-17 ENCOUNTER — Encounter: Admit: 2019-03-17 | Payer: PRIVATE HEALTH INSURANCE | Primary: Internal Medicine

## 2019-03-24 ENCOUNTER — Encounter: Admit: 2019-03-24 | Payer: PRIVATE HEALTH INSURANCE | Attending: Rheumatology | Primary: Internal Medicine

## 2019-03-27 ENCOUNTER — Telehealth: Admit: 2019-03-27 | Payer: PRIVATE HEALTH INSURANCE | Attending: Rheumatology | Primary: Internal Medicine

## 2019-03-27 NOTE — Telephone Encounter
Sally Russell called for her lab results. I will route this message to Dr. Roger Shelter.

## 2019-03-27 NOTE — Telephone Encounter
Called and updated. 

## 2019-04-09 ENCOUNTER — Telehealth: Admit: 2019-04-09 | Payer: PRIVATE HEALTH INSURANCE | Attending: Rheumatology | Primary: Internal Medicine

## 2019-04-09 NOTE — Telephone Encounter
Spoke with Sally Russell and she wants the rest of her lab results. The rest of the lab results are in Media section of her chart. I will send this message to Dr. Roger Shelter to review.

## 2019-04-09 NOTE — Telephone Encounter
Called and updated. 

## 2019-04-14 ENCOUNTER — Encounter: Admit: 2019-04-14 | Payer: PRIVATE HEALTH INSURANCE | Attending: Rheumatology | Primary: Internal Medicine

## 2019-05-19 ENCOUNTER — Encounter: Admit: 2019-05-19 | Payer: PRIVATE HEALTH INSURANCE | Attending: Rheumatology | Primary: Internal Medicine

## 2019-06-02 ENCOUNTER — Encounter: Admit: 2019-06-02 | Payer: PRIVATE HEALTH INSURANCE | Attending: Rheumatology | Primary: Internal Medicine
